# Patient Record
Sex: Female | Born: 1960 | Race: Black or African American | Hispanic: No | Marital: Single | Smoking: Never smoker
Health system: Southern US, Community
[De-identification: ages and names within clinical notes are randomized; demographics above are authoritative.]

## PROBLEM LIST (undated history)

## (undated) DIAGNOSIS — K219 Gastro-esophageal reflux disease without esophagitis: Secondary | ICD-10-CM

## (undated) DIAGNOSIS — Z87440 Personal history of urinary (tract) infections: Secondary | ICD-10-CM

## (undated) HISTORY — DX: Gastro-esophageal reflux disease without esophagitis: K21.9

## (undated) HISTORY — PX: OTHER SURGICAL HISTORY: SHX169

## (undated) HISTORY — PX: BARIATRIC SURGERY: SHX1103

## (undated) HISTORY — PX: TUBAL LIGATION: SHX77

---

## 1997-08-25 ENCOUNTER — Ambulatory Visit (HOSPITAL_COMMUNITY): Admission: RE | Admit: 1997-08-25 | Discharge: 1997-08-25 | Payer: Self-pay | Admitting: Family Medicine

## 2001-10-13 ENCOUNTER — Other Ambulatory Visit: Admission: RE | Admit: 2001-10-13 | Discharge: 2001-10-13 | Payer: Self-pay | Admitting: Gynecology

## 2002-10-14 ENCOUNTER — Other Ambulatory Visit: Admission: RE | Admit: 2002-10-14 | Discharge: 2002-10-14 | Payer: Self-pay | Admitting: Gynecology

## 2003-09-07 ENCOUNTER — Other Ambulatory Visit: Admission: RE | Admit: 2003-09-07 | Discharge: 2003-09-07 | Payer: Self-pay | Admitting: Gynecology

## 2004-06-26 ENCOUNTER — Other Ambulatory Visit: Admission: RE | Admit: 2004-06-26 | Discharge: 2004-06-26 | Payer: Self-pay | Admitting: Gynecology

## 2005-02-12 ENCOUNTER — Other Ambulatory Visit: Admission: RE | Admit: 2005-02-12 | Discharge: 2005-02-12 | Payer: Self-pay | Admitting: Gynecology

## 2006-02-13 ENCOUNTER — Other Ambulatory Visit: Admission: RE | Admit: 2006-02-13 | Discharge: 2006-02-13 | Payer: Self-pay | Admitting: Gynecology

## 2007-03-24 ENCOUNTER — Other Ambulatory Visit: Admission: RE | Admit: 2007-03-24 | Discharge: 2007-03-24 | Payer: Self-pay | Admitting: Gynecology

## 2008-07-19 ENCOUNTER — Emergency Department (HOSPITAL_COMMUNITY): Admission: EM | Admit: 2008-07-19 | Discharge: 2008-07-19 | Payer: Self-pay | Admitting: Family Medicine

## 2009-07-10 ENCOUNTER — Emergency Department (HOSPITAL_COMMUNITY): Admission: EM | Admit: 2009-07-10 | Discharge: 2009-07-10 | Payer: Self-pay | Admitting: Family Medicine

## 2009-10-07 ENCOUNTER — Emergency Department (HOSPITAL_COMMUNITY): Admission: EM | Admit: 2009-10-07 | Discharge: 2009-10-07 | Payer: Self-pay | Admitting: Family Medicine

## 2009-10-07 ENCOUNTER — Ambulatory Visit (HOSPITAL_COMMUNITY): Admission: RE | Admit: 2009-10-07 | Discharge: 2009-10-07 | Payer: Self-pay | Admitting: Family Medicine

## 2010-03-04 LAB — HM PAP SMEAR: HM Pap smear: NORMAL

## 2010-07-13 ENCOUNTER — Inpatient Hospital Stay (INDEPENDENT_AMBULATORY_CARE_PROVIDER_SITE_OTHER)
Admission: RE | Admit: 2010-07-13 | Discharge: 2010-07-13 | Disposition: A | Discharge status: Home / Self Care | Payer: BC Managed Care – PPO | Source: Ambulatory Visit | Attending: Family Medicine | Admitting: Family Medicine

## 2010-07-13 DIAGNOSIS — R109 Unspecified abdominal pain: Secondary | ICD-10-CM

## 2011-03-05 LAB — HM MAMMOGRAPHY: HM Mammogram: NORMAL

## 2011-11-12 ENCOUNTER — Ambulatory Visit: Payer: BC Managed Care – PPO | Admitting: Internal Medicine

## 2011-11-12 DIAGNOSIS — Z0289 Encounter for other administrative examinations: Secondary | ICD-10-CM

## 2012-03-13 ENCOUNTER — Emergency Department (INDEPENDENT_AMBULATORY_CARE_PROVIDER_SITE_OTHER)
Admission: EM | Admit: 2012-03-13 | Discharge: 2012-03-13 | Disposition: A | Discharge status: Home / Self Care | Payer: BC Managed Care – PPO | Source: Home / Self Care | Attending: Emergency Medicine | Admitting: Emergency Medicine

## 2012-03-13 ENCOUNTER — Encounter (HOSPITAL_COMMUNITY): Payer: Self-pay | Admitting: *Deleted

## 2012-03-13 DIAGNOSIS — N390 Urinary tract infection, site not specified: Secondary | ICD-10-CM

## 2012-03-13 LAB — POCT URINALYSIS DIP (DEVICE)
Glucose, UA: NEGATIVE mg/dL
Hgb urine dipstick: NEGATIVE
Ketones, ur: NEGATIVE mg/dL
Leukocytes, UA: NEGATIVE
Nitrite: NEGATIVE
Protein, ur: NEGATIVE mg/dL
Specific Gravity, Urine: >=1.03 (ref 1.005–1.030)
Urobilinogen, UA: 1 mg/dL (ref 0.0–1.0)
pH: 5.5 (ref 5.0–8.0)

## 2012-03-13 MED ORDER — PHENAZOPYRIDINE HCL 200 MG PO TABS: 200.0000 mg | ORAL_TABLET | Freq: Three times a day (TID) | ORAL | Status: DC | PRN

## 2012-03-13 MED ORDER — CEPHALEXIN 500 MG PO CAPS: 500.0000 mg | ORAL_CAPSULE | Freq: Three times a day (TID) | ORAL | Status: DC

## 2012-03-13 NOTE — ED Notes (Signed)
Pt reports frequent urination/lower back pain and pressure - denies any possible exposure to std or unusual discharge.

## 2012-03-13 NOTE — ED Provider Notes (Signed)
Chief Complaint  Patient presents with  . Urinary Tract Infection    History of Present Illness:   Lori Booth is a 52 year old female who has had a one-week history of urinary frequency, urgency, pelvic and lower back pain. She denies any dysuria or hematuria. She's had no fever, chills, nausea, or vomiting. She denies any vaginal discharge, itching, or irregular bleeding. She has had urinary tract infections in the past and this feels just like her usual urinary tract infection. Her last one was about 6 months ago.  Review of Systems:  Other than noted above, the patient denies any of the following symptoms: General:  No fevers, chills, sweats, aches, or fatigue. GI:  No abdominal pain, back pain, nausea, vomiting, diarrhea, or constipation. GU:  No dysuria, frequency, urgency, hematuria, or incontinence. GYN:  No discharge, itching, vulvar pain or lesions, pelvic pain, or abnormal vaginal bleeding.  PMFSH:  Past medical history, family history, social history, meds, and allergies were reviewed.  Physical Exam:   Vital signs:  BP 141/93  Pulse 82  Temp 97 F (36.1 C) (Oral)  Resp 18  SpO2 98% Gen:  Alert, oriented, in no distress. Lungs:  Clear to auscultation, no wheezes, rales or rhonchi. Heart:  Regular rhythm, no gallop or murmer. Abdomen:  Flat and soft. There was slight suprapubic pain to palpation.  No guarding, or rebound.  No hepato-splenomegaly or mass.  Bowel sounds were normally active.  No hernia. Back:  No CVA tenderness.  Skin:  Clear, warm and dry.  Labs:   Results for orders placed during the hospital encounter of 03/13/12  POCT URINALYSIS DIP (DEVICE)      Component Value Range   Glucose, UA NEGATIVE  NEGATIVE mg/dL   Bilirubin Urine SMALL (*) NEGATIVE   Ketones, ur NEGATIVE  NEGATIVE mg/dL   Specific Gravity, Urine >=1.030  1.005 - 1.030   Hgb urine dipstick NEGATIVE  NEGATIVE   pH 5.5  5.0 - 8.0   Protein, ur NEGATIVE  NEGATIVE mg/dL   Urobilinogen, UA 1.0   0.0 - 1.0 mg/dL   Nitrite NEGATIVE  NEGATIVE   Leukocytes, UA NEGATIVE  NEGATIVE     Other Labs Obtained at Urgent Care Center:  A urine culture was obtained.  Results are pending at this time and we will call about any positive results.  Assessment: The encounter diagnosis was UTI (lower urinary tract infection).   Plan:   1.  The following meds were prescribed:   New Prescriptions   CEPHALEXIN (KEFLEX) 500 MG CAPSULE    Take 1 capsule (500 mg total) by mouth 3 (three) times daily.   PHENAZOPYRIDINE (PYRIDIUM) 200 MG TABLET    Take 1 tablet (200 mg total) by mouth 3 (three) times daily as needed for pain.   2.  The patient was instructed in symptomatic care and handouts were given. 3.  The patient was told to return if becoming worse in any way, if no better in 3 or 4 days, and given some red flag symptoms that would indicate earlier return. 4.  The patient was told to avoid intercourse for 10 days, get extra fluids, and return for a follow up with her primary care doctor at the completion of treatment for a repeat UA and culture.     Reuben Likes, MD 03/13/12 [phone]

## 2012-03-15 LAB — URINE CULTURE: Colony Count: >=100000

## 2012-03-16 NOTE — ED Notes (Signed)
Urine culture: >100,000 colonies Citrobacter Koseri.  Pt. Adequately treated with Keflex. Vassie Moselle 03/16/2012

## 2012-05-01 ENCOUNTER — Emergency Department (HOSPITAL_COMMUNITY)
Admission: EM | Admit: 2012-05-01 | Discharge: 2012-05-01 | Disposition: A | Discharge status: Home / Self Care | Payer: BC Managed Care – PPO | Source: Home / Self Care | Attending: Family Medicine | Admitting: Family Medicine

## 2012-05-01 ENCOUNTER — Encounter (HOSPITAL_COMMUNITY): Payer: Self-pay | Admitting: Emergency Medicine

## 2012-05-01 DIAGNOSIS — N390 Urinary tract infection, site not specified: Secondary | ICD-10-CM

## 2012-05-01 LAB — POCT URINALYSIS DIP (DEVICE)
Glucose, UA: NEGATIVE mg/dL
Hgb urine dipstick: NEGATIVE
Ketones, ur: NEGATIVE mg/dL
Leukocytes, UA: NEGATIVE
Nitrite: NEGATIVE
Protein, ur: NEGATIVE mg/dL
Specific Gravity, Urine: 1.025 (ref 1.005–1.030)
Urobilinogen, UA: 2 mg/dL — ABNORMAL HIGH (ref 0.0–1.0)
pH: 6 (ref 5.0–8.0)

## 2012-05-01 MED ORDER — CEPHALEXIN 500 MG PO CAPS: 500.0000 mg | ORAL_CAPSULE | Freq: Four times a day (QID) | ORAL | Status: DC

## 2012-05-01 NOTE — ED Notes (Signed)
Pt c/o pelvic pain x3 days Reports being seen x1 month here at the [facility] Given Keflex Today, sx include: headaches, chills, pain radiates down to legs Denies: hematuria, f/v/n/d, vag discharge, constipation  She is alert w/no signs of acute distress.

## 2012-05-01 NOTE — ED Provider Notes (Signed)
History     CSN: [id number]  Arrival date & time 05/01/12  1414   First MD Initiated Contact with Patient 05/01/12 1422      Chief Complaint  Patient presents with  . Pelvic Pain    (Consider location/radiation/quality/duration/timing/severity/associated sxs/prior treatment) Patient is a 52 y.o. female presenting with frequency. The history is provided by the patient.  Urinary Frequency This is a recurrent problem. The current episode started more than 2 days ago (had citrobacter uti in [place], reports she took all of medicine and sx resolved.). The problem has been gradually worsening. Pertinent negatives include no abdominal pain.    History reviewed. No pertinent past medical history.  History reviewed. No pertinent past surgical history.  No family history on file.  History  Substance Use Topics  . Smoking status: Never Smoker   . Smokeless tobacco: Not on file  . Alcohol Use: Yes     Comment: social    OB History   Grav Para Term Preterm Abortions TAB SAB Ect Mult Living                  Review of Systems  Constitutional: Negative.   Gastrointestinal: Negative.  Negative for abdominal pain.  Genitourinary: Positive for urgency, frequency and pelvic pain. Negative for dysuria, hematuria, vaginal bleeding, vaginal discharge, difficulty urinating, vaginal pain and menstrual problem.    Allergies  Review of patient's allergies indicates no known allergies.  Home Medications   Current Outpatient Rx  Name  Route  Sig  Dispense  Refill  . cephALEXin (KEFLEX) 500 MG capsule   Oral   Take 1 capsule (500 mg total) by mouth 3 (three) times daily.   30 capsule   0   . cephALEXin (KEFLEX) 500 MG capsule   Oral   Take 1 capsule (500 mg total) by mouth 4 (four) times daily. Take all of medicine and drink lots of fluids   20 capsule   0   . phenazopyridine (PYRIDIUM) 200 MG tablet   Oral   Take 1 tablet (200 mg total) by mouth 3 (three) times daily as needed for  pain.   15 tablet   0     BP 126/86  Pulse 85  Temp(Src) 98.9 F (37.2 C) (Oral)  Resp 18  SpO2 96%  Physical Exam  Nursing note and vitals reviewed. Constitutional: She is oriented to person, place, and time. She appears well-developed and well-nourished.  Abdominal: Soft. Bowel sounds are normal. She exhibits no distension and no mass. There is no tenderness. There is no rebound and no guarding.  Neurological: She is alert and oriented to person, place, and time.  Skin: Skin is warm and dry.    ED Course  Procedures (including critical care time)  Labs Reviewed  POCT URINALYSIS DIP (DEVICE) - Abnormal; Notable for the following:    Bilirubin Urine SMALL (*)    Urobilinogen, UA 2.0 (*)    All other components within normal limits  URINE CULTURE   No results found.   1. UTI (lower urinary tract infection)       MDM          Linna Hoff, MD 05/01/12 1537

## 2012-05-02 LAB — URINE CULTURE
Colony Count: 4000
Special Requests: NORMAL

## 2012-05-22 ENCOUNTER — Encounter (HOSPITAL_COMMUNITY): Payer: Self-pay | Admitting: Family Medicine

## 2012-05-22 ENCOUNTER — Emergency Department (HOSPITAL_COMMUNITY): Payer: BC Managed Care – PPO

## 2012-05-22 ENCOUNTER — Emergency Department (HOSPITAL_COMMUNITY)
Admission: EM | Admit: 2012-05-22 | Discharge: 2012-05-22 | Disposition: A | Discharge status: Home / Self Care | Payer: BC Managed Care – PPO | Attending: Emergency Medicine | Admitting: Emergency Medicine

## 2012-05-22 DIAGNOSIS — N390 Urinary tract infection, site not specified: Secondary | ICD-10-CM | POA: Insufficient documentation

## 2012-05-22 DIAGNOSIS — R300 Dysuria: Secondary | ICD-10-CM | POA: Insufficient documentation

## 2012-05-22 DIAGNOSIS — R51 Headache: Secondary | ICD-10-CM | POA: Insufficient documentation

## 2012-05-22 DIAGNOSIS — R509 Fever, unspecified: Secondary | ICD-10-CM | POA: Insufficient documentation

## 2012-05-22 DIAGNOSIS — R3915 Urgency of urination: Secondary | ICD-10-CM | POA: Insufficient documentation

## 2012-05-22 DIAGNOSIS — R110 Nausea: Secondary | ICD-10-CM | POA: Insufficient documentation

## 2012-05-22 HISTORY — DX: Personal history of urinary (tract) infections: Z87.440

## 2012-05-22 LAB — CBC WITH DIFFERENTIAL/PLATELET
Basophils Absolute: 0 10*3/uL (ref 0.0–0.1)
Basophils Relative: 0 % (ref 0–1)
Eosinophils Absolute: 0 10*3/uL (ref 0.0–0.7)
Eosinophils Relative: 1 % (ref 0–5)
HCT: 34.2 % — ABNORMAL LOW (ref 36.0–46.0)
Hemoglobin: 11.8 g/dL — ABNORMAL LOW (ref 12.0–15.0)
Lymphocytes Relative: 15 % (ref 12–46)
Lymphs Abs: 0.6 10*3/uL — ABNORMAL LOW (ref 0.7–4.0)
MCH: 28.3 pg (ref 26.0–34.0)
MCHC: 34.5 g/dL (ref 30.0–36.0)
MCV: 82 fL (ref 78.0–100.0)
Monocytes Absolute: 0.5 10*3/uL (ref 0.1–1.0)
Monocytes Relative: 13 % — ABNORMAL HIGH (ref 3–12)
Neutro Abs: 2.8 10*3/uL (ref 1.7–7.7)
Neutrophils Relative %: 71 % (ref 43–77)
Platelets: 192 10*3/uL (ref 150–400)
RBC: 4.17 MIL/uL (ref 3.87–5.11)
RDW: 13.7 % (ref 11.5–15.5)
WBC: 4 10*3/uL (ref 4.0–10.5)

## 2012-05-22 LAB — URINALYSIS, ROUTINE W REFLEX MICROSCOPIC
Bilirubin Urine: NEGATIVE
Glucose, UA: NEGATIVE mg/dL
Ketones, ur: NEGATIVE mg/dL
Nitrite: NEGATIVE
Protein, ur: NEGATIVE mg/dL
Specific Gravity, Urine: 1.02 (ref 1.005–1.030)
Urobilinogen, UA: 0.2 mg/dL (ref 0.0–1.0)
pH: 5 (ref 5.0–8.0)

## 2012-05-22 LAB — BASIC METABOLIC PANEL
BUN: 12 mg/dL (ref 6–23)
CO2: 25 mEq/L (ref 19–32)
Calcium: 9.8 mg/dL (ref 8.4–10.5)
Chloride: 101 mEq/L (ref 96–112)
Creatinine, Ser: 0.65 mg/dL (ref 0.50–1.10)
GFR calc Af Amer: >90 mL/min (ref >90)
GFR calc non Af Amer: >90 mL/min (ref >90)
Glucose, Bld: 119 mg/dL — ABNORMAL HIGH (ref 70–99)
Potassium: 3.3 mEq/L — ABNORMAL LOW (ref 3.5–5.1)
Sodium: 137 mEq/L (ref 135–145)

## 2012-05-22 LAB — URINE MICROSCOPIC-ADD ON

## 2012-05-22 LAB — WET PREP, GENITAL
Clue Cells Wet Prep HPF POC: NONE SEEN
Trich, Wet Prep: NONE SEEN
Yeast Wet Prep HPF POC: NONE SEEN

## 2012-05-22 MED ORDER — CIPROFLOXACIN HCL 500 MG PO TABS: 500.0000 mg | ORAL_TABLET | Freq: Two times a day (BID) | ORAL | Status: DC

## 2012-05-22 MED ORDER — MORPHINE SULFATE 4 MG/ML IJ SOLN
4.0000 mg | Freq: Once | INTRAMUSCULAR | Status: AC
  Administered 2012-05-22: 4 mg via INTRAVENOUS
  Filled 2012-05-22: qty 1

## 2012-05-22 MED ORDER — SODIUM CHLORIDE 0.9 % IV BOLUS (SEPSIS)
1000.0000 mL | Freq: Once | INTRAVENOUS | Status: AC
  Administered 2012-05-22: 1000 mL via INTRAVENOUS

## 2012-05-22 MED ORDER — HYDROCODONE-ACETAMINOPHEN 5-325 MG PO TABS: 1.0000 | ORAL_TABLET | Freq: Four times a day (QID) | ORAL | Status: DC | PRN

## 2012-05-22 MED ORDER — IBUPROFEN 800 MG PO TABS
800.0000 mg | ORAL_TABLET | Freq: Once | ORAL | Status: AC
  Administered 2012-05-22: 800 mg via ORAL
  Filled 2012-05-22: qty 1

## 2012-05-22 MED ORDER — CIPROFLOXACIN IN D5W 400 MG/200ML IV SOLN
400.0000 mg | Freq: Once | INTRAVENOUS | Status: AC
  Administered 2012-05-22: 400 mg via INTRAVENOUS
  Filled 2012-05-22: qty 200

## 2012-05-22 NOTE — ED Notes (Signed)
Pelvic Cart at bedside 

## 2012-05-22 NOTE — ED Notes (Signed)
Patient transported to CT 

## 2012-05-22 NOTE — ED Provider Notes (Signed)
History     CSN: [id number]  Arrival date & time 05/22/12  0531   First MD Initiated Contact with Patient 05/22/12 [phone]      Chief Complaint  Patient presents with  . Pelvic Pain    (Consider location/radiation/quality/duration/timing/severity/associated sxs/prior treatment) HPI Mrs. Leu is a 52 year old female with past history of recurrent UTIs who presents to the ED with pelvic pain and dysuria. Her last was in February and she also had one in January. She reports frequently having UTIs and has had around 10 in the last couple years. Last night around midnight she developed burning on urination, urgency, and nausea. She also developed a fever, chills, and a mild headache. She states the pain and symptoms are very similar to her previous UTIs. She also reports having some vaginal discomfort and burning with urination. She denies vaginal discharge as well as any prior STI. She denies back pain, chest pain, SOB, vomiting, and hematuria.  Past Medical History  Diagnosis Date  . History of frequent urinary tract infections     Per pt, over the past 15 months, has had 7 UTI's    Past Surgical History  Procedure Laterality Date  . Tubal ligation      Family History  Problem Relation Age of Onset  . Stroke Father     History  Substance Use Topics  . Smoking status: Never Smoker   . Smokeless tobacco: Not on file  . Alcohol Use: Yes     Comment: social    OB History   Grav Para Term Preterm Abortions TAB SAB Ect Mult Living                  Review of Systems All other systems negative except as documented in the HPI. All pertinent positives and negatives as reviewed in the HPI.  Allergies  Review of patient's allergies indicates no known allergies.  Home Medications   Current Outpatient Rx  Name  Route  Sig  Dispense  Refill  . Estradiol Acetate [facility])   Vaginal   Place 1 application vaginally.           BP 110/65  Pulse 112  Temp(Src) 99.6 F  (37.6 C) (Oral)  Resp 16  Ht 5\' 3"  (1.6 m)  Wt 250 lb (113.399 kg)  BMI 44.3 kg/m2  SpO2 99%  Physical Exam  Nursing note and vitals reviewed. Constitutional: She is oriented to person, place, and time. She appears well-developed and well-nourished.  HENT:  Head: Normocephalic and atraumatic.  Mouth/Throat: Oropharynx is clear and moist.  Eyes: Pupils are equal, round, and reactive to light.  Cardiovascular: Normal rate, regular rhythm and normal heart sounds.  Exam reveals no gallop and no friction rub.   No murmur heard. Pulmonary/Chest: Effort normal and breath sounds normal.  Abdominal: Soft. Normal appearance and bowel sounds are normal. There is no rigidity, no rebound, no guarding and no CVA tenderness. No hernia.    Mild tenderness to palpation of suprapubic and inguinal areas of abdomen. No CVA tenderness.  Genitourinary: There is no rash or lesion on the right labia. There is no rash or lesion on the left labia. Uterus is not enlarged and not tender. Cervix exhibits no motion tenderness, no discharge and no friability. Right adnexum displays no mass and no tenderness. Left adnexum displays no mass and no tenderness. No erythema, tenderness or bleeding around the vagina.  Small amount of thin, watery discharge.  Neurological: She is alert  and oriented to person, place, and time. Coordination normal.  Skin: Skin is warm and dry.    ED Course  Procedures (including critical care time)  Labs Reviewed  URINALYSIS, ROUTINE W REFLEX MICROSCOPIC - Abnormal; Notable for the following:    Hgb urine dipstick LARGE (*)    Leukocytes, UA SMALL (*)    All other components within normal limits  CBC WITH DIFFERENTIAL - Abnormal; Notable for the following:    Hemoglobin 11.8 (*)    HCT 34.2 (*)    Lymphs Abs 0.6 (*)    Monocytes Relative 13 (*)    All other components within normal limits  BASIC METABOLIC PANEL - Abnormal; Notable for the following:    Potassium 3.3 (*)     Glucose, Bld 119 (*)    All other components within normal limits  URINE MICROSCOPIC-ADD ON - Abnormal; Notable for the following:    Squamous Epithelial / LPF FEW (*)    All other components within normal limits  WET PREP, GENITAL  GC/CHLAMYDIA PROBE AMP  URINE CULTURE   Patient retreated for UTI, based on her symptoms and history of present illness.  Patient is feeling better following pain medication and fluids.  Patient is referred back to her primary doctor.  Also advised to return to the emergency department for any worsening in her condition and is advised the patient that this still could represent an evolving process, such as appendicitis that has not fully declared itself.  Patient voices an understanding and all questions were answered. MDM  MDM Reviewed: nursing note and vitals Interpretation: labs and CT scan            Carlyle Dolly, PA-C 05/23/12 2346

## 2012-05-22 NOTE — ED Notes (Addendum)
Pt reports hx of UTI's, states this feels the same. Pt c/o right sided and left sided pelvic pain. Pt c/o burning and pain on urination, frequency, some incontinence r/t frequency, chills, headache, and nausea.

## 2012-05-23 LAB — GC/CHLAMYDIA PROBE AMP
CT Probe RNA: NEGATIVE
GC Probe RNA: NEGATIVE

## 2012-05-23 LAB — URINE CULTURE: Colony Count: 3000

## 2012-05-26 NOTE — ED Provider Notes (Signed)
Medical screening examination/treatment/procedure(s) were performed by non-physician practitioner and as supervising physician I was immediately available for consultation/collaboration.  Olivia Mackie, MD 05/26/12 [phone]

## 2012-06-02 ENCOUNTER — Ambulatory Visit (INDEPENDENT_AMBULATORY_CARE_PROVIDER_SITE_OTHER): Payer: BC Managed Care – PPO | Admitting: Internal Medicine

## 2012-06-02 ENCOUNTER — Other Ambulatory Visit (INDEPENDENT_AMBULATORY_CARE_PROVIDER_SITE_OTHER): Payer: BC Managed Care – PPO

## 2012-06-02 ENCOUNTER — Encounter: Payer: Self-pay | Admitting: Internal Medicine

## 2012-06-02 VITALS — BP 118/78 | HR 76 | Temp 97.5°F | Resp 16 | Ht 63.0 in | Wt 207.0 lb

## 2012-06-02 DIAGNOSIS — N6310 Unspecified lump in the right breast, unspecified quadrant: Secondary | ICD-10-CM

## 2012-06-02 DIAGNOSIS — R7309 Other abnormal glucose: Secondary | ICD-10-CM

## 2012-06-02 DIAGNOSIS — Z23 Encounter for immunization: Secondary | ICD-10-CM

## 2012-06-02 DIAGNOSIS — N289 Disorder of kidney and ureter, unspecified: Secondary | ICD-10-CM

## 2012-06-02 DIAGNOSIS — N390 Urinary tract infection, site not specified: Secondary | ICD-10-CM

## 2012-06-02 DIAGNOSIS — N63 Unspecified lump in unspecified breast: Secondary | ICD-10-CM

## 2012-06-02 DIAGNOSIS — R739 Hyperglycemia, unspecified: Secondary | ICD-10-CM | POA: Insufficient documentation

## 2012-06-02 DIAGNOSIS — R399 Unspecified symptoms and signs involving the genitourinary system: Secondary | ICD-10-CM | POA: Insufficient documentation

## 2012-06-02 DIAGNOSIS — E785 Hyperlipidemia, unspecified: Secondary | ICD-10-CM | POA: Insufficient documentation

## 2012-06-02 LAB — COMPREHENSIVE METABOLIC PANEL
ALT: 28 U/L (ref 0–35)
AST: 18 U/L (ref 0–37)
Albumin: 3.9 g/dL (ref 3.5–5.2)
Alkaline Phosphatase: 57 U/L (ref 39–117)
BUN: 10 mg/dL (ref 6–23)
CO2: 28 mEq/L (ref 19–32)
Calcium: 9.4 mg/dL (ref 8.4–10.5)
Chloride: 103 mEq/L (ref 96–112)
Creatinine, Ser: 0.6 mg/dL (ref 0.4–1.2)
GFR: 138.63 mL/min (ref >60.00)
Glucose, Bld: 92 mg/dL (ref 70–99)
Potassium: 4.2 mEq/L (ref 3.5–5.1)
Sodium: 138 mEq/L (ref 135–145)
Total Bilirubin: 0.5 mg/dL (ref 0.3–1.2)
Total Protein: 8 g/dL (ref 6.0–8.3)

## 2012-06-02 LAB — URINALYSIS, ROUTINE W REFLEX MICROSCOPIC
Bilirubin Urine: NEGATIVE
Hgb urine dipstick: NEGATIVE
Ketones, ur: NEGATIVE
Leukocytes, UA: NEGATIVE
Nitrite: NEGATIVE
Specific Gravity, Urine: >=1.03 (ref 1.000–1.030)
Total Protein, Urine: NEGATIVE
Urine Glucose: NEGATIVE
Urobilinogen, UA: 0.2 (ref 0.0–1.0)
pH: 6 (ref 5.0–8.0)

## 2012-06-02 LAB — CBC WITH DIFFERENTIAL/PLATELET
Basophils Absolute: 0 10*3/uL (ref 0.0–0.1)
Basophils Relative: 0.5 % (ref 0.0–3.0)
Eosinophils Absolute: 0.2 10*3/uL (ref 0.0–0.7)
Eosinophils Relative: 2.8 % (ref 0.0–5.0)
HCT: 40.2 % (ref 36.0–46.0)
Hemoglobin: 13.2 g/dL (ref 12.0–15.0)
Lymphocytes Relative: 31.8 % (ref 12.0–46.0)
Lymphs Abs: 2.2 10*3/uL (ref 0.7–4.0)
MCHC: 32.8 g/dL (ref 30.0–36.0)
MCV: 85.7 fl (ref 78.0–100.0)
Monocytes Absolute: 0.5 10*3/uL (ref 0.1–1.0)
Monocytes Relative: 7.2 % (ref 3.0–12.0)
Neutro Abs: 3.9 10*3/uL (ref 1.4–7.7)
Neutrophils Relative %: 57.7 % (ref 43.0–77.0)
Platelets: 275 10*3/uL (ref 150.0–400.0)
RBC: 4.69 Mil/uL (ref 3.87–5.11)
RDW: 14.1 % (ref 11.5–14.6)
WBC: 6.8 10*3/uL (ref 4.5–10.5)

## 2012-06-02 LAB — LDL CHOLESTEROL, DIRECT: Direct LDL: 149.6 mg/dL

## 2012-06-02 LAB — LIPID PANEL
Cholesterol: 230 mg/dL — ABNORMAL HIGH (ref 0–200)
HDL: 61.2 mg/dL (ref >39.00)
Total CHOL/HDL Ratio: 4
Triglycerides: 107 mg/dL (ref 0.0–149.0)
VLDL: 21.4 mg/dL (ref 0.0–40.0)

## 2012-06-02 LAB — TSH: TSH: 1.06 u[IU]/mL (ref 0.35–5.50)

## 2012-06-02 LAB — HEMOGLOBIN A1C: Hgb A1c MFr Bld: 5.7 % (ref 4.6–6.5)

## 2012-06-02 NOTE — Patient Instructions (Signed)
Urinary Tract Infection Urinary tract infections (UTIs) can develop anywhere along your urinary tract. Your urinary tract is your body's drainage system for removing wastes and extra water. Your urinary tract includes two kidneys, two ureters, a bladder, and a urethra. Your kidneys are a pair of bean-shaped organs. Each kidney is about the size of your fist. They are located below your ribs, one on each side of your spine. CAUSES Infections are caused by microbes, which are microscopic organisms, including fungi, viruses, and bacteria. These organisms are so small that they can only be seen through a microscope. Bacteria are the microbes that most commonly cause UTIs. SYMPTOMS  Symptoms of UTIs may vary by age and gender of the patient and by the location of the infection. Symptoms in young women typically include a frequent and intense urge to urinate and a painful, burning feeling in the bladder or urethra during urination. Older women and men are more likely to be tired, shaky, and weak and have muscle aches and abdominal pain. A fever may mean the infection is in your kidneys. Other symptoms of a kidney infection include pain in your back or sides below the ribs, nausea, and vomiting. DIAGNOSIS To diagnose a UTI, your caregiver will ask you about your symptoms. Your caregiver also will ask to provide a urine sample. The urine sample will be tested for bacteria and white blood cells. White blood cells are made by your body to help fight infection. TREATMENT  Typically, UTIs can be treated with medication. Because most UTIs are caused by a bacterial infection, they usually can be treated with the use of antibiotics. The choice of antibiotic and length of treatment depend on your symptoms and the type of bacteria causing your infection. HOME CARE INSTRUCTIONS  If you were prescribed antibiotics, take them exactly as your caregiver instructs you. Finish the medication even if you feel better after you  have only taken some of the medication.  Drink enough water and fluids to keep your urine clear or pale yellow.  Avoid caffeine, tea, and carbonated beverages. They tend to irritate your bladder.  Empty your bladder often. Avoid holding urine for long periods of time.  Empty your bladder before and after sexual intercourse.  After a bowel movement, women should cleanse from front to back. Use each tissue only once. SEEK MEDICAL CARE IF:   You have back pain.  You develop a fever.  Your symptoms do not begin to resolve within 3 days. SEEK IMMEDIATE MEDICAL CARE IF:   You have severe back pain or lower abdominal pain.  You develop chills.  You have nausea or vomiting.  You have continued burning or discomfort with urination. MAKE SURE YOU:   Understand these instructions.  Will watch your condition.  Will get help right away if you are not doing well or get worse. Document Released: 11/28/2004 Document Revised: 08/20/2011 Document Reviewed: 03/29/2011 [employer] Patient Information 2013 [employer].  

## 2012-06-03 ENCOUNTER — Encounter: Payer: Self-pay | Admitting: Internal Medicine

## 2012-06-03 NOTE — Assessment & Plan Note (Signed)
I have asked her to get a diagnostic mammogram done

## 2012-06-03 NOTE — Assessment & Plan Note (Signed)
I will check her A1C to see if she has developed DM2 

## 2012-06-03 NOTE — Assessment & Plan Note (Signed)
She needs to see urology about the recurrent UTI and the abnormal CT scan of the right collecting system

## 2012-06-03 NOTE — Progress Notes (Signed)
Subjective:    Patient ID: Lori Booth, female    DOB: [birth year], 52 y.o.   MRN: [id number]  Urinary Tract Infection  This is a recurrent problem. The current episode started more than 1 year ago. The problem occurs intermittently. The problem has been resolved. The pain is at a severity of 0/10. The patient is experiencing no pain. There has been no fever. The fever has been present for less than 1 day. She is sexually active. There is a history of pyelonephritis. Associated symptoms include chills. Pertinent negatives include no discharge, flank pain, frequency, hematuria, hesitancy, nausea, possible pregnancy, sweats, urgency or vomiting. She has tried antibiotics for the symptoms. The treatment provided mild relief. Her past medical history is significant for recurrent UTIs and urinary stasis. There is no history of catheterization, kidney stones, a single kidney or a urological procedure.      Review of Systems  Constitutional: Positive for chills. Negative for fever, diaphoresis, activity change, appetite change, fatigue and unexpected weight change.  HENT: Negative.   Eyes: Negative.   Respiratory: Negative.   Cardiovascular: Negative.   Gastrointestinal: Negative.  Negative for nausea, vomiting, abdominal pain, diarrhea and constipation.  Endocrine: Negative.   Genitourinary: Negative.  Negative for dysuria, hesitancy, urgency, frequency, hematuria, flank pain, decreased urine volume, vaginal bleeding, vaginal discharge, enuresis, difficulty urinating, genital sores, vaginal pain, menstrual problem, pelvic pain and dyspareunia.  Musculoskeletal: Negative.  Negative for back pain.  Skin: Negative.   Allergic/Immunologic: Negative.   Neurological: Negative.   Hematological: Negative.   Psychiatric/Behavioral: Negative.        Objective:   Physical Exam  Vitals reviewed. Constitutional: She is oriented to person, place, and time. She appears well-developed and well-nourished. No  distress.  HENT:  Head: Normocephalic and atraumatic.  Mouth/Throat: Oropharynx is clear and moist. No oropharyngeal exudate.  Eyes: Conjunctivae are normal. Right eye exhibits no discharge. Left eye exhibits no discharge. No scleral icterus.  Neck: Normal range of motion. Neck supple. No JVD present. No tracheal deviation present. No thyromegaly present.  Cardiovascular: Normal rate, regular rhythm, normal heart sounds and intact distal pulses.  Exam reveals no gallop and no friction rub.   No murmur heard. Pulmonary/Chest: Effort normal and breath sounds normal. No stridor. No respiratory distress. She has no wheezes. She has no rales. Chest wall is not dull to percussion. She exhibits no mass, no tenderness, no bony tenderness, no laceration, no crepitus, no edema, no deformity, no swelling and no retraction. Right breast exhibits no inverted nipple, no mass, no nipple discharge, no skin change and no tenderness. Left breast exhibits no inverted nipple, no mass, no nipple discharge, no skin change and no tenderness. Breasts are asymmetrical.  Right breast is diffusely larger, more firm, and more nodular than the left breast but I do not feel any discreet mass.  Abdominal: Soft. Bowel sounds are normal. She exhibits no distension. There is no tenderness. There is no rebound and no guarding.  Musculoskeletal: Normal range of motion. She exhibits no edema and no tenderness.  Lymphadenopathy:    She has no cervical adenopathy.  Neurological: She is oriented to person, place, and time.  Skin: Skin is warm and dry. No rash noted. She is not diaphoretic. No erythema. No pallor.  Psychiatric: She has a normal mood and affect. Her behavior is normal. Judgment and thought content normal.     Lab Results  Component Value Date   WBC 6.8 06/02/2012   HGB 13.2 06/02/2012  HCT 40.2 06/02/2012   PLT 275.0 06/02/2012   GLUCOSE 92 06/02/2012   CHOL 230* 06/02/2012   TRIG 107.0 06/02/2012   HDL 61.20 06/02/2012    LDLDIRECT 149.6 06/02/2012   ALT 28 06/02/2012   AST 18 06/02/2012   NA 138 06/02/2012   K 4.2 06/02/2012   CL 103 06/02/2012   CREATININE 0.6 06/02/2012   BUN 10 06/02/2012   CO2 28 06/02/2012   TSH 1.06 06/02/2012   HGBA1C 5.7 06/02/2012       Assessment & Plan:

## 2012-06-03 NOTE — Assessment & Plan Note (Signed)
FLP today 

## 2012-06-05 LAB — CULTURE, URINE COMPREHENSIVE: Colony Count: >=100000

## 2012-06-11 LAB — HM PAP SMEAR: HM Pap smear: NORMAL

## 2012-07-23 ENCOUNTER — Ambulatory Visit (INDEPENDENT_AMBULATORY_CARE_PROVIDER_SITE_OTHER): Payer: BC Managed Care – PPO | Admitting: Obstetrics & Gynecology

## 2012-07-23 ENCOUNTER — Other Ambulatory Visit: Payer: Self-pay | Admitting: Obstetrics & Gynecology

## 2012-07-23 ENCOUNTER — Encounter: Payer: Self-pay | Admitting: Obstetrics & Gynecology

## 2012-07-23 VITALS — BP 133/90 | HR 73 | Temp 97.8°F | Ht 63.0 in | Wt 210.0 lb

## 2012-07-23 DIAGNOSIS — N898 Other specified noninflammatory disorders of vagina: Secondary | ICD-10-CM

## 2012-07-23 DIAGNOSIS — Z01419 Encounter for gynecological examination (general) (routine) without abnormal findings: Secondary | ICD-10-CM

## 2012-07-23 DIAGNOSIS — N9489 Other specified conditions associated with female genital organs and menstrual cycle: Secondary | ICD-10-CM

## 2012-07-23 DIAGNOSIS — Z780 Asymptomatic menopausal state: Secondary | ICD-10-CM

## 2012-07-23 MED ORDER — ESTRADIOL 2 MG VA RING: 2.0000 mg | VAGINAL_RING | VAGINAL | Status: DC

## 2012-07-23 NOTE — Patient Instructions (Addendum)
Atrophic Vaginitis Atrophic vaginitis is a problem of low levels of estrogen in women. This problem can happen at any age. It is most common in women who have gone through menopause ("the change").  HOW WILL I KNOW IF I HAVE THIS PROBLEM? You may have:  Trouble with peeing (urinating), such as:  Going to the bathroom often.  A hard time holding your pee until you reach a bathroom.  Leaking pee.  Having pain when you pee.  Itching or a burning feeling.  Vaginal bleeding and spotting.  Pain during sex.  Dryness of the vagina.  A yellow, bad-smelling fluid (discharge) coming from the vagina. HOW WILL MY DOCTOR CHECK FOR THIS PROBLEM?  During your exam, your doctor will likely find the problem.  If there is a vaginal fluid, it may be checked for infection. HOW WILL THIS PROBLEM BE TREATED? Keep the vulvar skin as clean as possible. Moisturizers and lubricants can help with some of the symptoms. Estrogen replacement can help. There are 2 ways to take estrogen:  Systemic estrogen gets estrogen to your whole body. It takes many weeks or months before the symptoms get better.  You take an estrogen pill.  You use a skin patch. This is a patch that you put on your skin.  If you still have your uterus, your doctor may ask you to take a hormone. Talk to your doctor about the right medicine for you.  Estrogen cream.  This puts estrogen only at the part of your body where you apply it. The cream is put into the vagina or put on the vulvar skin. For some women, estrogen cream works faster than pills or the patch. CAN ALL WOMEN WITH THIS PROBLEM USE ESTROGEN? No. Women with certain types of cancer, liver problems, or problems with blood clots should not take estrogen. Your doctor can help you decide the best treatment for your symptoms. Document Released: 08/07/2007 Document Revised: 05/13/2011 Document Reviewed: 08/07/2007 [facility] Patient Information 2014 [place], [place]. Health  Maintenance, Females A healthy lifestyle and preventative care can promote health and wellness.  Maintain regular health, dental, and eye exams.  Eat a healthy diet. Foods like vegetables, fruits, whole grains, low-fat dairy products, and lean protein foods contain the nutrients you need without too many calories. Decrease your intake of foods high in solid fats, added sugars, and salt. Get information about a proper diet from your caregiver, if necessary.  Regular physical exercise is one of the most important things you can do for your health. Most adults should get at least 150 minutes of moderate-intensity exercise (any activity that increases your heart rate and causes you to sweat) each week. In addition, most adults need muscle-strengthening exercises on 2 or more days a week.   Maintain a healthy weight. The body mass index (BMI) is a screening tool to identify possible weight problems. It provides an estimate of body fat based on height and weight. Your caregiver can help determine your BMI, and can help you achieve or maintain a healthy weight. For adults 20 years and older:  A BMI below 18.5 is considered underweight.  A BMI of 18.5 to 24.9 is normal.  A BMI of 25 to 29.9 is considered overweight.  A BMI of 30 and above is considered obese.  Maintain normal blood lipids and cholesterol by exercising and minimizing your intake of saturated fat. Eat a balanced diet with plenty of fruits and vegetables. Blood tests for lipids and cholesterol should begin at age  20 and be repeated every 5 years. If your lipid or cholesterol levels are high, you are over 50, or you are a high risk for heart disease, you may need your cholesterol levels checked more frequently.Ongoing high lipid and cholesterol levels should be treated with medicines if diet and exercise are not effective.  If you smoke, find out from your caregiver how to quit. If you do not use tobacco, do not start.  If you are  pregnant, do not drink alcohol. If you are breastfeeding, be very cautious about drinking alcohol. If you are not pregnant and choose to drink alcohol, do not exceed 1 drink per day. One drink is considered to be 12 ounces (355 mL) of beer, 5 ounces (148 mL) of wine, or 1.5 ounces (44 mL) of liquor.  Avoid use of street drugs. Do not share needles with anyone. Ask for help if you need support or instructions about stopping the use of drugs.  High blood pressure causes heart disease and increases the risk of stroke. Blood pressure should be checked at least every 1 to 2 years. Ongoing high blood pressure should be treated with medicines, if weight loss and exercise are not effective.  If you are 85 to 50 years old, ask your caregiver if you should take aspirin to prevent strokes.  Diabetes screening involves taking a blood sample to check your fasting blood sugar level. This should be done once every 3 years, after age 11, if you are within normal weight and without risk factors for diabetes. Testing should be considered at a younger age or be carried out more frequently if you are overweight and have at least 1 risk factor for diabetes.  Breast cancer screening is essential preventative care for women. You should practice "breast self-awareness." This means understanding the normal appearance and feel of your breasts and may include breast self-examination. Any changes detected, no matter how small, should be reported to a caregiver. Women in their 34s and 30s should have a clinical breast exam (CBE) by a caregiver as part of a regular health exam every 1 to 3 years. After age 24, women should have a CBE every year. Starting at age 56, women should consider having a mammogram (breast X-ray) every year. Women who have a family history of breast cancer should talk to their caregiver about genetic screening. Women at a high risk of breast cancer should talk to their caregiver about having an MRI and a  mammogram every year.  The Pap test is a screening test for cervical cancer. Women should have a Pap test starting at age 33. Between ages 13 and 34, Pap tests should be repeated every 2 years. Beginning at age 57, you should have a Pap test every 3 years as long as the past 3 Pap tests have been normal. If you had a hysterectomy for a problem that was not cancer or a condition that could lead to cancer, then you no longer need Pap tests. If you are between ages 29 and 28, and you have had normal Pap tests going back 10 years, you no longer need Pap tests. If you have had past treatment for cervical cancer or a condition that could lead to cancer, you need Pap tests and screening for cancer for at least 20 years after your treatment. If Pap tests have been discontinued, risk factors (such as a new sexual partner) need to be reassessed to determine if screening should be resumed. Some women have medical problems  that increase the chance of getting cervical cancer. In these cases, your caregiver may recommend more frequent screening and Pap tests.  The human papillomavirus (HPV) test is an additional test that may be used for cervical cancer screening. The HPV test looks for the virus that can cause the cell changes on the cervix. The cells collected during the Pap test can be tested for HPV. The HPV test could be used to screen women aged 16 years and older, and should be used in women of any age who have unclear Pap test results. After the age of 18, women should have HPV testing at the same frequency as a Pap test.  Colorectal cancer can be detected and often prevented. Most routine colorectal cancer screening begins at the age of 46 and continues through age 45. However, your caregiver may recommend screening at an earlier age if you have risk factors for colon cancer. On a yearly basis, your caregiver may provide home test kits to check for hidden blood in the stool. Use of a small camera at the end of a  tube, to directly examine the colon (sigmoidoscopy or colonoscopy), can detect the earliest forms of colorectal cancer. Talk to your caregiver about this at age 80, when routine screening begins. Direct examination of the colon should be repeated every 5 to 10 years through age 87, unless early forms of pre-cancerous polyps or small growths are found.  Hepatitis C blood testing is recommended for all people born from [birth year] through 1963 and any individual with known risks for hepatitis C.  Practice safe sex. Use condoms and avoid high-risk sexual practices to reduce the spread of sexually transmitted infections (STIs). Sexually active women aged 94 and younger should be checked for Chlamydia, which is a common sexually transmitted infection. Older women with new or multiple partners should also be tested for Chlamydia. Testing for other STIs is recommended if you are sexually active and at increased risk.  Osteoporosis is a disease in which the bones lose minerals and strength with aging. This can result in serious bone fractures. The risk of osteoporosis can be identified using a bone density scan. Women ages 19 and over and women at risk for fractures or osteoporosis should discuss screening with their caregivers. Ask your caregiver whether you should be taking a calcium supplement or vitamin D to reduce the rate of osteoporosis.  Menopause can be associated with physical symptoms and risks. Hormone replacement therapy is available to decrease symptoms and risks. You should talk to your caregiver about whether hormone replacement therapy is right for you.  Use sunscreen with a sun protection factor (SPF) of 30 or greater. Apply sunscreen liberally and repeatedly throughout the day. You should seek shade when your shadow is shorter than you. Protect yourself by wearing long sleeves, pants, a wide-brimmed hat, and sunglasses year round, whenever you are outdoors.  Notify your caregiver of new moles or  changes in moles, especially if there is a change in shape or color. Also notify your caregiver if a mole is larger than the size of a pencil eraser.  Stay current with your immunizations. Document Released: 09/03/2010 Document Revised: 05/13/2011 Document Reviewed: 09/03/2010 [facility] Patient Information 2014 [place], [place].

## 2012-07-23 NOTE — Progress Notes (Signed)
Subjective:     Lori Booth is a 52 y.o. female here for a routine exam.  Current complaints: for an annual. Pt states she is having vaginal dryness. Personal health questionnaire reviewed: yes.   Gynecologic History No LMP recorded. Patient is postmenopausal. Contraception: none Last Pap: 2012. Results were: normal Last mammogram: 2014. Results were: normal  Obstetric History OB History   Grav Para Term Preterm Abortions TAB SAB Ect Mult Living                   The following portions of the patient's history were reviewed and updated as appropriate: allergies, current medications, past family history, past medical history, past social history, past surgical history and problem list.  Review of Systems Pertinent items are noted in HPI.    Objective:      General appearance: alert Breasts: normal appearance, no masses or tenderness Abdomen: soft, non-tender; bowel sounds normal; no masses,  no organomegaly Pelvic: cervix normal in appearance, external genitalia normal, no adnexal masses or tenderness, uterus normal size, shape, and consistency and vagina normal without discharge    Assessment:    Healthy female exam.  Vaginal dryness   Plan:   Resume vaginal estrogen

## 2012-07-28 LAB — PAP IG, CT-NG NAA, HPV HIGH-RISK
Chlamydia Probe Amp: NEGATIVE
GC Probe Amp: NEGATIVE
HPV DNA High Risk: NOT DETECTED

## 2013-04-06 ENCOUNTER — Ambulatory Visit
Admission: RE | Admit: 2013-04-06 | Discharge: 2013-04-06 | Disposition: A | Discharge status: Home / Self Care | Payer: BC Managed Care – PPO | Source: Ambulatory Visit | Attending: Obstetrics & Gynecology | Admitting: Obstetrics & Gynecology

## 2013-04-06 DIAGNOSIS — Z780 Asymptomatic menopausal state: Secondary | ICD-10-CM

## 2013-04-13 ENCOUNTER — Encounter: Payer: Self-pay | Admitting: Obstetrics & Gynecology

## 2013-05-26 LAB — HM DEXA SCAN: HM Dexa Scan: NORMAL

## 2013-06-03 ENCOUNTER — Ambulatory Visit (INDEPENDENT_AMBULATORY_CARE_PROVIDER_SITE_OTHER): Payer: BC Managed Care – PPO | Admitting: Internal Medicine

## 2013-06-03 ENCOUNTER — Other Ambulatory Visit (INDEPENDENT_AMBULATORY_CARE_PROVIDER_SITE_OTHER): Payer: BC Managed Care – PPO

## 2013-06-03 ENCOUNTER — Encounter: Payer: Self-pay | Admitting: Internal Medicine

## 2013-06-03 VITALS — BP 110/80 | HR 73 | Temp 97.8°F | Resp 16 | Ht 63.0 in | Wt 209.5 lb

## 2013-06-03 DIAGNOSIS — Z0000 Encounter for general adult medical examination without abnormal findings: Secondary | ICD-10-CM

## 2013-06-03 DIAGNOSIS — Z1231 Encounter for screening mammogram for malignant neoplasm of breast: Secondary | ICD-10-CM | POA: Insufficient documentation

## 2013-06-03 LAB — CBC WITH DIFFERENTIAL/PLATELET
Basophils Absolute: 0 10*3/uL (ref 0.0–0.1)
Basophils Relative: 0.4 % (ref 0.0–3.0)
Eosinophils Absolute: 0.2 10*3/uL (ref 0.0–0.7)
Eosinophils Relative: 2.7 % (ref 0.0–5.0)
HCT: 38.8 % (ref 36.0–46.0)
Hemoglobin: 12.8 g/dL (ref 12.0–15.0)
Lymphocytes Relative: 31.2 % (ref 12.0–46.0)
Lymphs Abs: 2.2 10*3/uL (ref 0.7–4.0)
MCHC: 33 g/dL (ref 30.0–36.0)
MCV: 83.9 fl (ref 78.0–100.0)
Monocytes Absolute: 0.4 10*3/uL (ref 0.1–1.0)
Monocytes Relative: 5.8 % (ref 3.0–12.0)
Neutro Abs: 4.2 10*3/uL (ref 1.4–7.7)
Neutrophils Relative %: 59.9 % (ref 43.0–77.0)
Platelets: 280 10*3/uL (ref 150.0–400.0)
RBC: 4.63 Mil/uL (ref 3.87–5.11)
RDW: 14.4 % (ref 11.5–14.6)
WBC: 7.1 10*3/uL (ref 4.5–10.5)

## 2013-06-03 LAB — COMPREHENSIVE METABOLIC PANEL
ALT: 22 U/L (ref 0–35)
AST: 18 U/L (ref 0–37)
Albumin: 3.8 g/dL (ref 3.5–5.2)
Alkaline Phosphatase: 60 U/L (ref 39–117)
BUN: 8 mg/dL (ref 6–23)
CO2: 30 mEq/L (ref 19–32)
Calcium: 9.7 mg/dL (ref 8.4–10.5)
Chloride: 101 mEq/L (ref 96–112)
Creatinine, Ser: 0.6 mg/dL (ref 0.4–1.2)
GFR: 132.86 mL/min (ref >60.00)
Glucose, Bld: 98 mg/dL (ref 70–99)
Potassium: 3.8 mEq/L (ref 3.5–5.1)
Sodium: 138 mEq/L (ref 135–145)
Total Bilirubin: 0.5 mg/dL (ref 0.3–1.2)
Total Protein: 7.7 g/dL (ref 6.0–8.3)

## 2013-06-03 LAB — LIPID PANEL
Cholesterol: 214 mg/dL — ABNORMAL HIGH (ref 0–200)
HDL: 62.2 mg/dL (ref >39.00)
LDL Cholesterol: 130 mg/dL — ABNORMAL HIGH (ref 0–99)
Total CHOL/HDL Ratio: 3
Triglycerides: 111 mg/dL (ref 0.0–149.0)
VLDL: 22.2 mg/dL (ref 0.0–40.0)

## 2013-06-03 LAB — TSH: TSH: 1.06 u[IU]/mL (ref 0.35–5.50)

## 2013-06-03 NOTE — Progress Notes (Signed)
Pre visit review using our clinic review tool, if applicable. No additional management support is needed unless otherwise documented below in the visit note. 

## 2013-06-03 NOTE — Patient Instructions (Signed)

## 2013-06-03 NOTE — Progress Notes (Signed)
   Subjective:    Patient ID: Lori Booth, female    DOB: June 30, 1960, 53 y.o.   MRN: [id number]  HPI Comments: She returns for a complete physical and she tells me that she feels well and offers no complaints.     Review of Systems  All other systems reviewed and are negative.       Objective:   Physical Exam  Vitals reviewed. Constitutional: She is oriented to person, place, and time. She appears well-developed and well-nourished. No distress.  HENT:  Head: Normocephalic and atraumatic.  Mouth/Throat: Oropharynx is clear and moist. No oropharyngeal exudate.  Eyes: Conjunctivae are normal. Right eye exhibits no discharge. Left eye exhibits no discharge. No scleral icterus.  Neck: Normal range of motion. Neck supple. No JVD present. No tracheal deviation present. No thyromegaly present.  Cardiovascular: Normal rate, regular rhythm, normal heart sounds and intact distal pulses.  Exam reveals no gallop and no friction rub.   No murmur heard. Pulmonary/Chest: Effort normal and breath sounds normal. No stridor. No respiratory distress. She has no wheezes. She has no rales. Chest wall is not dull to percussion. She exhibits no mass, no tenderness, no bony tenderness, no laceration, no crepitus, no edema, no deformity, no swelling and no retraction. Right breast exhibits no inverted nipple, no mass, no nipple discharge, no skin change and no tenderness. Left breast exhibits no inverted nipple, no mass, no nipple discharge, no skin change and no tenderness. Breasts are symmetrical.  Abdominal: Soft. Bowel sounds are normal. She exhibits no distension and no mass. There is no tenderness. There is no rebound and no guarding.  Genitourinary: Rectum normal. Rectal exam shows no external hemorrhoid, no internal hemorrhoid, no fissure, no mass, no tenderness and anal tone normal. Guaiac negative stool. No breast swelling, tenderness, discharge or bleeding. Pelvic exam was performed with patient supine.   Musculoskeletal: Normal range of motion. She exhibits no edema and no tenderness.  Lymphadenopathy:    She has no cervical adenopathy.  Neurological: She is oriented to person, place, and time.  Skin: Skin is warm and dry. No rash noted. She is not diaphoretic. No erythema. No pallor.  Psychiatric: She has a normal mood and affect. Her behavior is normal. Judgment and thought content normal.     Lab Results  Component Value Date   WBC 6.8 06/02/2012   HGB 13.2 06/02/2012   HCT 40.2 06/02/2012   PLT 275.0 06/02/2012   GLUCOSE 92 06/02/2012   CHOL 230* 06/02/2012   TRIG 107.0 06/02/2012   HDL 61.20 06/02/2012   LDLDIRECT 149.6 06/02/2012   ALT 28 06/02/2012   AST 18 06/02/2012   NA 138 06/02/2012   K 4.2 06/02/2012   CL 103 06/02/2012   CREATININE 0.6 06/02/2012   BUN 10 06/02/2012   CO2 28 06/02/2012   TSH 1.06 06/02/2012   HGBA1C 5.7 06/02/2012       Assessment & Plan:

## 2013-06-03 NOTE — Assessment & Plan Note (Signed)
Exam done Vaccines were reviewed She was referred for mammo and colonoscopy Labs ordered Pt ed material was given

## 2013-06-04 ENCOUNTER — Encounter: Payer: Self-pay | Admitting: Internal Medicine

## 2013-06-04 LAB — HEPATITIS C ANTIBODY: HCV Ab: NEGATIVE

## 2013-06-21 LAB — HM MAMMOGRAPHY: HM Mammogram: NORMAL

## 2013-07-16 LAB — HM COLONOSCOPY: HM Colonoscopy: NORMAL

## 2013-07-20 ENCOUNTER — Telehealth: Payer: Self-pay | Admitting: Internal Medicine

## 2013-07-20 NOTE — Telephone Encounter (Signed)
[facility] 4 pages to Dr. Yetta BarreJones

## 2013-07-29 ENCOUNTER — Ambulatory Visit (INDEPENDENT_AMBULATORY_CARE_PROVIDER_SITE_OTHER): Payer: BC Managed Care – PPO | Admitting: Obstetrics & Gynecology

## 2013-07-29 ENCOUNTER — Encounter: Payer: Self-pay | Admitting: Obstetrics & Gynecology

## 2013-07-29 VITALS — BP 130/88 | HR 84 | Temp 97.0°F | Ht 63.0 in | Wt 212.0 lb

## 2013-07-29 DIAGNOSIS — Z01419 Encounter for gynecological examination (general) (routine) without abnormal findings: Secondary | ICD-10-CM

## 2013-07-29 NOTE — Progress Notes (Signed)
Subjective:     Lori Booth is a 53 y.o. female here for a routine exam.   Personal health questionnaire:  Is patient Lori Booth, have a family history of breast and/or ovarian cancer: no Is there a family history of uterine cancer diagnosed at age < 3250, gastrointestinal cancer, urinary tract cancer, family member who is a Personnel officerLynch syndrome-associated carrier: yes Is the patient overweight and hypertensive, family history of diabetes, personal history of gestational diabetes or PCOS: yes Is patient over 7655, have PCOS,  family history of premature CHD under age 51, diabetes, smoke, have hypertension or peripheral artery disease:  no   Gynecologic History No LMP recorded. Patient is postmenopausal.  Last Pap: 1 yr ago. Results were: normal Last mammogram: wnl.   Obstetric History OB History  No data available    Past Medical History  Diagnosis Date  . History of frequent urinary tract infections     Per pt, over the past 15 months, has had 7 UTI's    Past Surgical History  Procedure Laterality Date  . Tubal ligation    . Cesarean section    . Cyrocautery      Current outpatient prescriptions:estradiol (ESTRING) 2 MG vaginal ring, Place 2 mg vaginally every 3 (three) months. follow package directions, Disp: 1 each, Rfl: 12;  PEG 3350-KCl-NaBcb-NaCl-NaSulf (PEG-3350/ELECTROLYTES) 236 G SOLR, , Disp: , Rfl:  No Known Allergies  History  Substance Use Topics  . Smoking status: Never Smoker   . Smokeless tobacco: Never Used  . Alcohol Use: No     Comment: social    Family History  Problem Relation Age of Onset  . Stroke Father   . Heart disease Father   . Hyperlipidemia Father   . Arthritis Mother   . Diabetes Sister   . Cancer Neg Hx   . Alcohol abuse Neg Hx   . Drug abuse Neg Hx   . Early death Neg Hx   . Hearing loss Neg Hx   . Hypertension Neg Hx   . Kidney disease Neg Hx       Review of Systems  Constitutional: negative for fatigue and weight  loss Respiratory: negative for cough and wheezing Cardiovascular: negative for chest pain, fatigue and palpitations Gastrointestinal: negative for abdominal pain and change in bowel habits Musculoskeletal:negative for myalgias Neurological: negative for gait problems and tremors Behavioral/Psych: negative for abusive relationship, depression Endocrine: negative for temperature intolerance   Genitourinary:negative for abnormal menstrual periods, genital lesions, hot flashes, sexual problems and vaginal discharge Integument/breast: negative for breast lump, breast tenderness, nipple discharge and skin lesion(s)    Objective:       BP 130/88  Pulse 84  Temp(Src) 97 F (36.1 C)  Ht 5\' 3"  (1.6 m)  Wt 96.163 kg (212 lb)  BMI 37.56 kg/m2 General:   alert  Skin:   no rash or abnormalities  Lungs:   clear to auscultation bilaterally  Heart:   regular rate and rhythm, S1, S2 normal, no murmur, click, rub or gallop  Breasts:   normal without suspicious masses, skin or nipple changes or axillary nodes  Abdomen:  normal findings: no organomegaly, soft, non-tender and no hernia  Pelvis:  External genitalia: normal general appearance Urinary system: urethral meatus normal and bladder without fullness, nontender Vaginal: normal without tenderness, induration or masses Cervix: normal appearance Adnexa: normal bimanual exam Uterus: anteverted and non-tender, normal size   Lab Review  Labs reviewed no Radiologic studies reviewed no   Assessment:  Healthy female exam.    Plan:    Education reviewed: calcium supplements and weight bearing exercise.   Meds ordered this encounter  Medications  . PEG 3350-KCl-NaBcb-NaCl-NaSulf (PEG-3350/ELECTROLYTES) 236 G SOLR    Sig:     Follow up as needed.

## 2013-07-29 NOTE — Patient Instructions (Signed)
Bone Health Our bones do many things. They provide structure, protect organs, anchor muscles, and store calcium. Adequate calcium in your diet and weight-bearing physical activity help build strong bones, improve bone amounts, and may reduce the risk of weakening of bones (osteoporosis) later in life. PEAK BONE MASS By age 51, the average woman has acquired most of her skeletal bone mass. A large decline occurs in older adults which increases the risk of osteoporosis. In women this occurs around the time of menopause. It is important for young girls to reach their peak bone mass in order to maintain bone health throughout life. A person with high bone mass as a young adult will be more likely to have a higher bone mass later in life. Not enough calcium consumption and physical activity early on could result in a failure to achieve optimum bone mass in adulthood. OSTEOPOROSIS Osteoporosis is a disease of the bones. It is defined as low bone mass with deterioration of bone structure. Osteoporosis leads to an increase risk of fractures with falls. These fractures commonly happen in the wrist, hip, and spine. While men and women of all ages and background can develop osteoporosis, some of the risk factors for osteoporosis are:  Female.  White.  Postmenopausal.  Older adults.  Small in body size.  Eating a diet low in calcium.  Physically inactive.  Smoking.  Use of some medications.  Family history. CALCIUM Calcium is a mineral needed by the body for healthy bones, teeth, and proper function of the heart, muscles, and nerves. The body cannot produce calcium so it must be absorbed through food. Good sources of calcium include:  Dairy products (low fat or nonfat milk, cheese, and yogurt).  Dark green leafy vegetables (bok choy and broccoli).  Calcium fortified foods (orange juice, cereal, bread, soy beverages, and tofu products).  Nuts (almonds). Recommended amounts of calcium vary  for individuals. RECOMMENDED CALCIUM INTAKES Age and Amount in mg per day  Children 1 to 3 years / 700 mg  Children 4 to 8 years / 1,000 mg  Children 9 to 13 years / 1,300 mg  Teens 14 to 18 years / 1,300 mg  Adults 19 to 50 years / 1,000 mg  Adult women 51 to 70 years / 1,200 mg  Adults 71 years and older / 1,200 mg  Pregnant and breastfeeding teens / 1,300 mg  Pregnant and breastfeeding adults / 1,000 mg Vitamin D also plays an important role in healthy bone development. Vitamin D helps in the absorption of calcium. WEIGHT-BEARING PHYSICAL ACTIVITY Regular physical activity has many positive health benefits. Benefits include strong bones. Weight-bearing physical activity early in life is important in reaching peak bone mass. Weight-bearing physical activities cause muscles and bones to work against gravity. Some examples of weight bearing physical activities include:  Walking, jogging, or running.  Field Hockey.  Jumping rope.  Dancing.  Soccer.  Tennis or Racquetball.  Stair climbing.  Basketball.  Hiking.  Weight lifting.  Aerobic fitness classes. Including weight-bearing physical activity into an exercise plan is a great way to keep bones healthy. Adults: Engage in at least 30 minutes of moderate physical activity on most, preferably all, days of the week. Children: Engage in at least 60 minutes of moderate physical activity on most, preferably all, days of the week. FOR MORE INFORMATION [place] Department of Agriculture, Center for Nutrition Policy and Promotion: www.cnpp.usda.gov National Osteoporosis Foundation: www.nof.org Document Released: 05/11/2003 Document Revised: 06/15/2012 Document Reviewed: 08/10/2008 [employer] Patient Information   2014 [employer].  

## 2013-08-13 ENCOUNTER — Encounter: Payer: Self-pay | Admitting: Internal Medicine

## 2013-08-13 ENCOUNTER — Ambulatory Visit (INDEPENDENT_AMBULATORY_CARE_PROVIDER_SITE_OTHER): Payer: BC Managed Care – PPO | Admitting: Internal Medicine

## 2013-08-13 VITALS — BP 122/80 | HR 92 | Temp 97.7°F | Resp 16 | Ht 63.0 in | Wt 207.8 lb

## 2013-08-13 DIAGNOSIS — K219 Gastro-esophageal reflux disease without esophagitis: Secondary | ICD-10-CM

## 2013-08-13 MED ORDER — ESOMEPRAZOLE MAGNESIUM 40 MG PO CPDR: 40.0000 mg | DELAYED_RELEASE_CAPSULE | Freq: Every day | ORAL | Status: DC

## 2013-08-13 NOTE — Progress Notes (Signed)
   Subjective:    Patient ID: Lori Booth, female    DOB: [birth year], 53 y.o.   MRN: [id number]  Gastrophageal Reflux She complains of heartburn and water brash. She reports no abdominal pain, no belching, no chest pain, no choking, no coughing, no dysphagia, no early satiety, no globus sensation, no hoarse voice, no nausea, no sore throat, no stridor, no tooth decay or no wheezing. This is a chronic problem. The current episode started more than 1 month ago. The problem occurs frequently. The problem has been rapidly worsening. The heartburn duration is several minutes. The heartburn is located in the substernum. The heartburn is of mild intensity. The heartburn does not wake her from sleep. The heartburn does not limit her activity. The heartburn doesn't change with position. The symptoms are aggravated by stress. Pertinent negatives include no anemia, fatigue, melena, muscle weakness, orthopnea or weight loss. She has tried an antacid for the symptoms. The treatment provided mild relief.      Review of Systems  Constitutional: Negative.  Negative for fever, chills, weight loss, diaphoresis, appetite change and fatigue.  HENT: Negative.  Negative for hoarse voice and sore throat.   Eyes: Negative.   Respiratory: Negative.  Negative for cough, choking and wheezing.   Cardiovascular: Negative.  Negative for chest pain, palpitations and leg swelling.  Gastrointestinal: Positive for heartburn. Negative for dysphagia, nausea, vomiting, abdominal pain, diarrhea, constipation, blood in stool and melena.  Endocrine: Negative.   Genitourinary: Negative.   Musculoskeletal: Negative.  Negative for muscle weakness.  Skin: Negative.   Allergic/Immunologic: Negative.   Neurological: Negative.   Hematological: Negative.  Negative for adenopathy. Does not bruise/bleed easily.  Psychiatric/Behavioral: Negative.        Objective:   Physical Exam  Vitals reviewed. Constitutional: She is oriented to  person, place, and time. She appears well-developed and well-nourished. No distress.  HENT:  Head: Normocephalic and atraumatic.  Mouth/Throat: Oropharynx is clear and moist. No oropharyngeal exudate.  Eyes: Conjunctivae are normal. Right eye exhibits no discharge. Left eye exhibits no discharge. No scleral icterus.  Neck: Normal range of motion. Neck supple. No JVD present. No tracheal deviation present. No thyromegaly present.  Cardiovascular: Normal rate, regular rhythm, normal heart sounds and intact distal pulses.  Exam reveals no gallop and no friction rub.   No murmur heard. Pulmonary/Chest: Effort normal and breath sounds normal. No stridor. No respiratory distress. She has no wheezes. She has no rales. She exhibits no tenderness.  Abdominal: Soft. Bowel sounds are normal. She exhibits no distension and no mass. There is no tenderness. There is no rebound and no guarding.  Musculoskeletal: Normal range of motion. She exhibits no edema and no tenderness.  Lymphadenopathy:    She has no cervical adenopathy.  Neurological: She is oriented to person, place, and time.  Skin: Skin is warm and dry. No rash noted. She is not diaphoretic. No erythema. No pallor.          Assessment & Plan:

## 2013-08-13 NOTE — Progress Notes (Signed)
Pre visit review using our clinic review tool, if applicable. No additional management support is needed unless otherwise documented below in the visit note. 

## 2013-08-13 NOTE — Assessment & Plan Note (Signed)
Will treat this with nexium 

## 2013-08-13 NOTE — Patient Instructions (Signed)
Gastroesophageal Reflux Disease, Adult  Gastroesophageal reflux disease (GERD) happens when acid from your stomach flows up into the esophagus. When acid comes in contact with the esophagus, the acid causes soreness (inflammation) in the esophagus. Over time, GERD may create small holes (ulcers) in the lining of the esophagus.  CAUSES   · Increased body weight. This puts pressure on the stomach, making acid rise from the stomach into the esophagus.  · Smoking. This increases acid production in the stomach.  · Drinking alcohol. This causes decreased pressure in the lower esophageal sphincter (valve or ring of muscle between the esophagus and stomach), allowing acid from the stomach into the esophagus.  · Late evening meals and a full stomach. This increases pressure and acid production in the stomach.  · A malformed lower esophageal sphincter.  Sometimes, no cause is found.  SYMPTOMS   · Burning pain in the lower part of the mid-chest behind the breastbone and in the mid-stomach area. This may occur twice a week or more often.  · Trouble swallowing.  · Sore throat.  · Dry cough.  · Asthma-like symptoms including chest tightness, shortness of breath, or wheezing.  DIAGNOSIS   Your caregiver may be able to diagnose GERD based on your symptoms. In some cases, X-rays and other tests may be done to check for complications or to check the condition of your stomach and esophagus.  TREATMENT   Your caregiver may recommend over-the-counter or prescription medicines to help decrease acid production. Ask your caregiver before starting or adding any new medicines.   HOME CARE INSTRUCTIONS   · Change the factors that you can control. Ask your caregiver for guidance concerning weight loss, quitting smoking, and alcohol consumption.  · Avoid foods and drinks that make your symptoms worse, such as:  · Caffeine or alcoholic drinks.  · Chocolate.  · Peppermint or mint flavorings.  · Garlic and onions.  · Spicy foods.  · Citrus fruits,  such as oranges, lemons, or limes.  · Tomato-based foods such as sauce, chili, salsa, and pizza.  · Fried and fatty foods.  · Avoid lying down for the 3 hours prior to your bedtime or prior to taking a nap.  · Eat small, frequent meals instead of large meals.  · Wear loose-fitting clothing. Do not wear anything tight around your waist that causes pressure on your stomach.  · Raise the head of your bed 6 to 8 inches with wood blocks to help you sleep. Extra pillows will not help.  · Only take over-the-counter or prescription medicines for pain, discomfort, or fever as directed by your caregiver.  · Do not take aspirin, ibuprofen, or other nonsteroidal anti-inflammatory drugs (NSAIDs).  SEEK IMMEDIATE MEDICAL CARE IF:   · You have pain in your arms, neck, jaw, teeth, or back.  · Your pain increases or changes in intensity or duration.  · You develop nausea, vomiting, or sweating (diaphoresis).  · You develop shortness of breath, or you faint.  · Your vomit is green, yellow, black, or looks like coffee grounds or blood.  · Your stool is red, bloody, or black.  These symptoms could be signs of other problems, such as heart disease, gastric bleeding, or esophageal bleeding.  MAKE SURE YOU:   · Understand these instructions.  · Will watch your condition.  · Will get help right away if you are not doing well or get worse.  Document Released: 11/28/2004 Document Revised: 05/13/2011 Document Reviewed: 09/07/2010  [employer]® Patient   Information ©2014 [employer].

## 2013-10-09 IMAGING — CT CT ABD-PELV W/O CM
2 of 4 series · 16 of 46 positions shown, 18 images · non-contrast
Comparison: None.

CLINICAL DATA: History of UTIs. Left flank pain.  Right-sided and
left-sided pelvic pain.  Pain upon urination.  Frequency.
Incontinence.  Chills.  Nausea.

CT ABDOMEN AND PELVIS WITHOUT CONTRAST
TECHNIQUE: Multidetector CT imaging of the abdomen and pelvis was
performed following the standard protocol without intravenous
contrast.

[Series 2: stone >200 lbs 5.0 b31f st · axial · 0.71mm/px · z∈[-496,-56]mm · 13 of 96 slices shown, 15 images]
[im 4/96  soft-tissue]
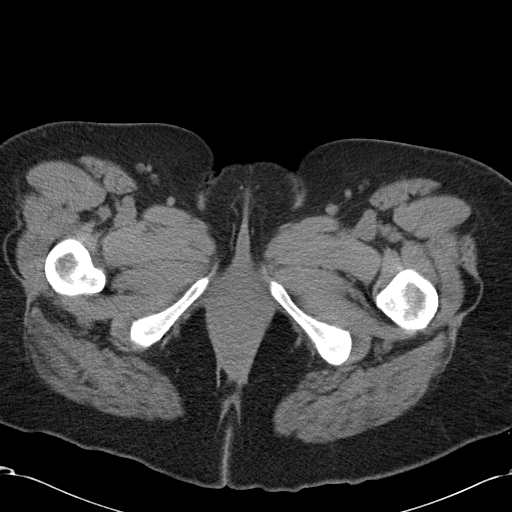
[im 4/96  bone]
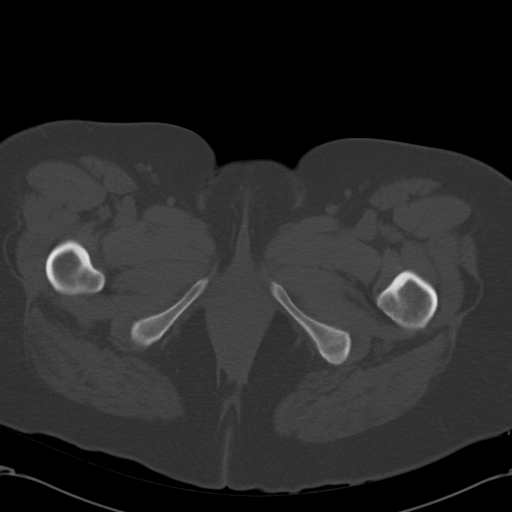
[im 11/96  soft-tissue]
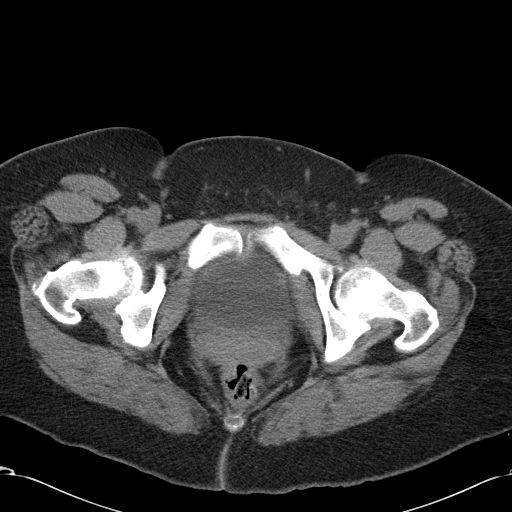
[im 19/96  soft-tissue]
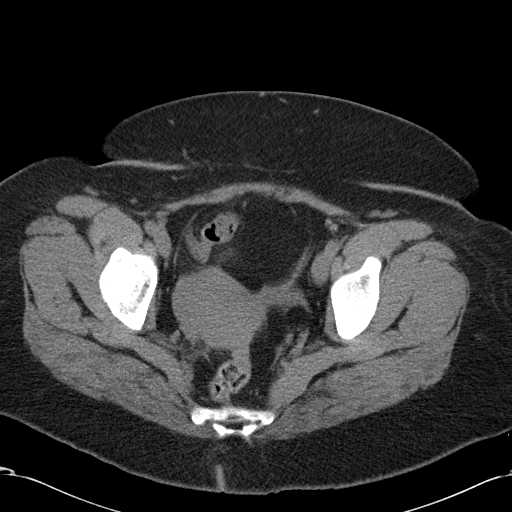
[im 26/96  soft-tissue]
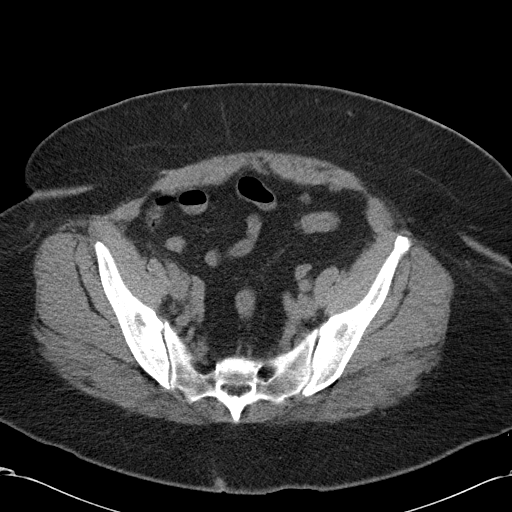
[im 33/96  soft-tissue]
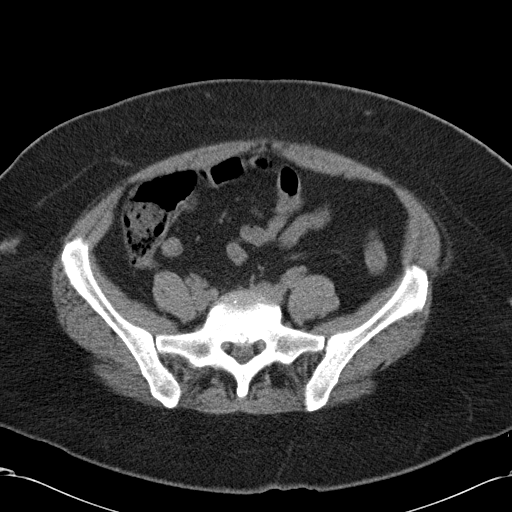
[im 41/96  soft-tissue]
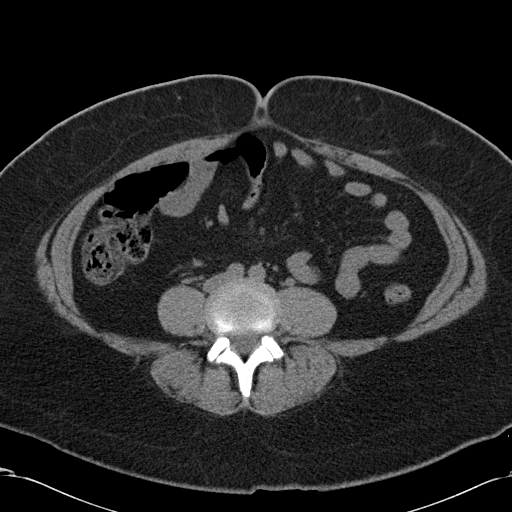
[im 48/96  soft-tissue]
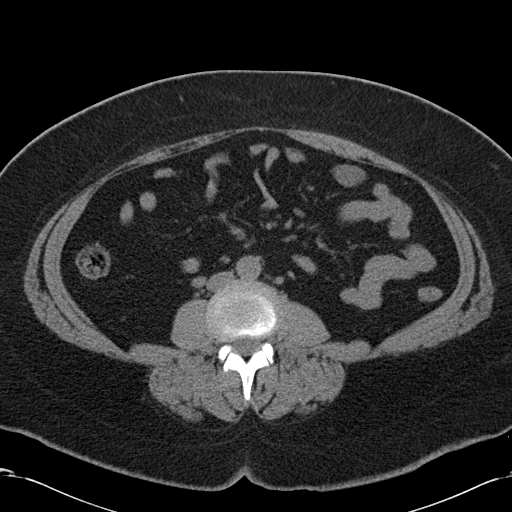
[im 55/96  soft-tissue]
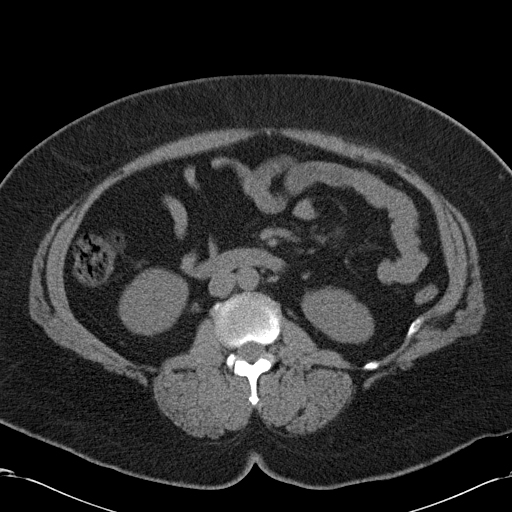
[im 63/96  soft-tissue]
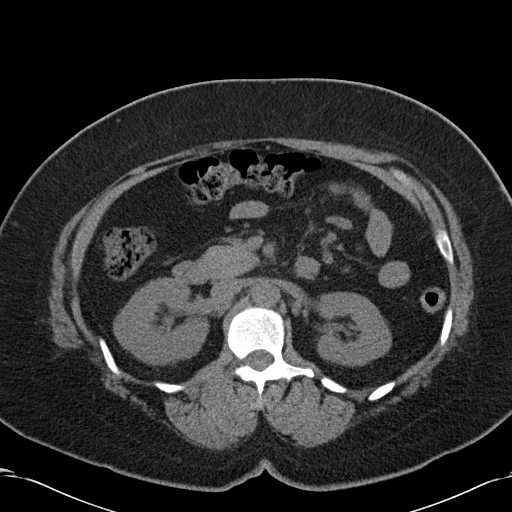
[im 63/96  bone]
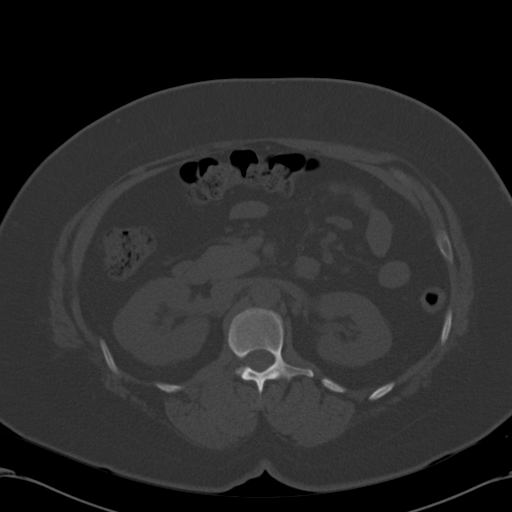
[im 70/96  soft-tissue]
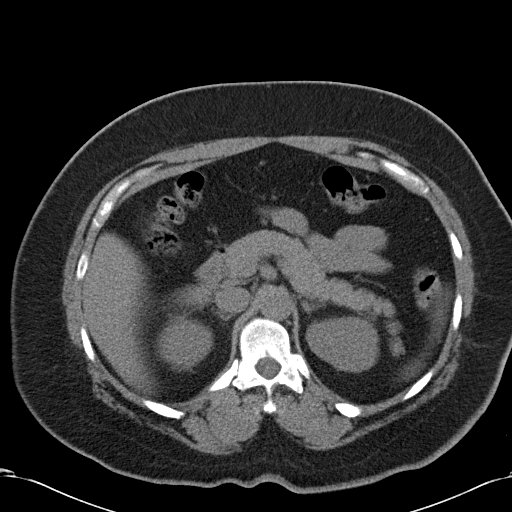
[im 77/96  soft-tissue]
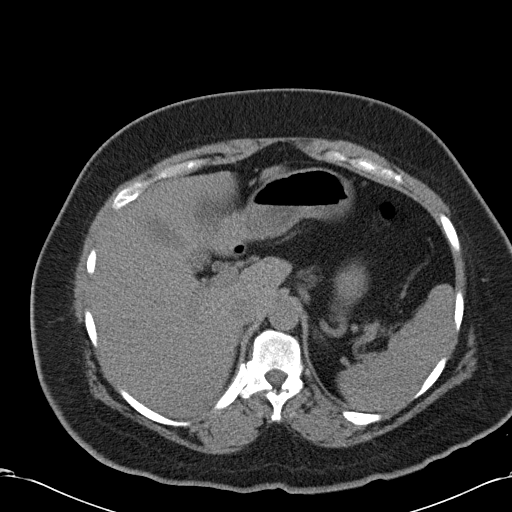
[im 85/96  soft-tissue]
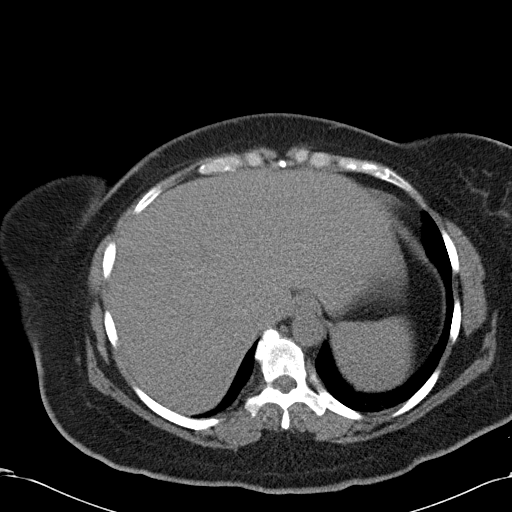
[im 92/96  soft-tissue]
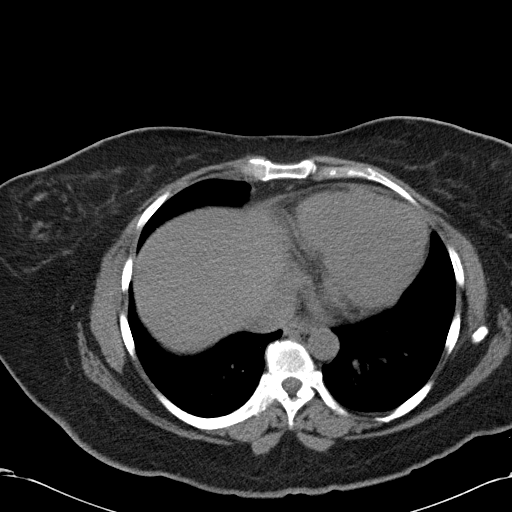

[Series 5: coronals cor · coronal · 0.77mm/px · 3 of 115 slices shown]
[im 39/115  soft-tissue]
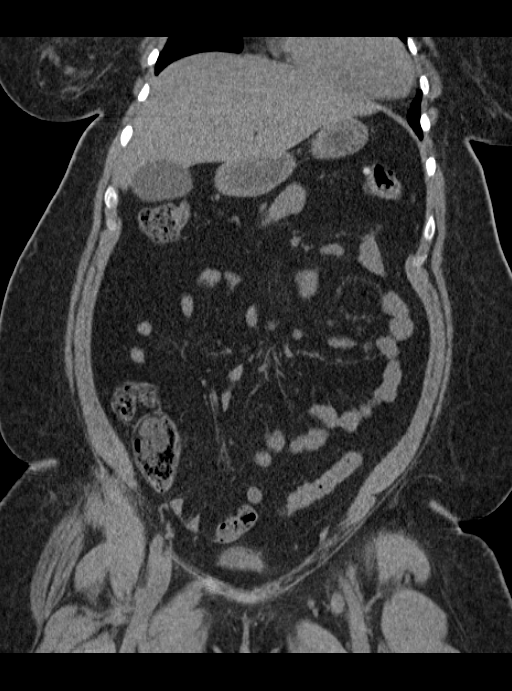
[im 51/115  soft-tissue]
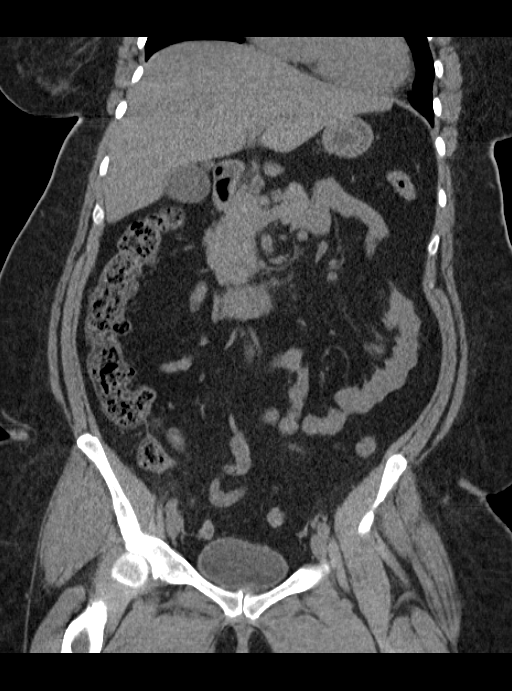
[im 64/115  soft-tissue]
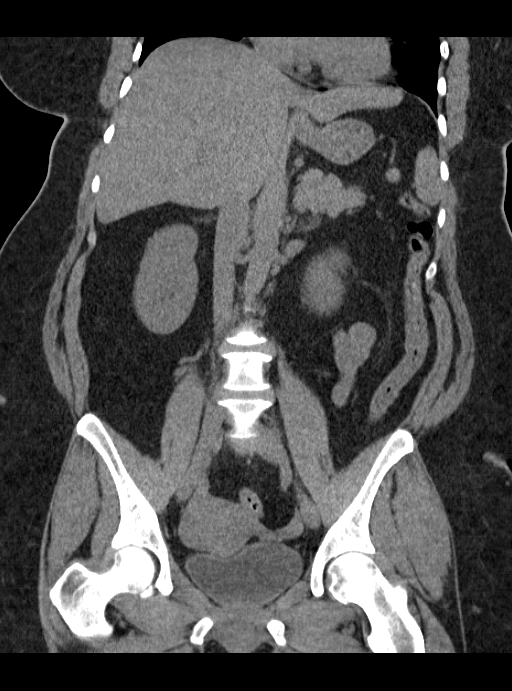

[16 of 46 positions shown; findings below may reference images not displayed]

FINDINGS: Lung bases clear.

Nodularity right breast tissue.  Correlation with mammography
recommended.

No renal or ureteral calculi or findings to suggest renal
collecting system obstruction.  Right renal collecting system
appears duplicated.  Phleboliths within the pelvis felt unlikely to
represent ureteral calculi.  No obvious renal mass noted on this
unenhanced exam.

Non contrast filled views of the urinary bladder without gross
abnormality.

Evaluation of solid abdominal viscera is limited by lack of IV
contrast.  Taking this limitation into account no worrisome
hepatic, splenic, pancreatic or adrenal lesion.  Small accessory
splenic tissue noted.

No calcified gallstones.

No extraluminal bowel inflammatory process, free fluid or free air.

Slight increased number of normal to top normal size mesenteric
lymph nodes.  Etiology/significance indeterminate.

No abdominal aortic aneurysm.

No bony destructive lesion.

Uterus tilted to the right.
IMPRESSION: No renal or ureteral calculi or findings to suggest renal
collecting system obstruction.

Non contrast filled views of the urinary bladder without gross
abnormality.

No extraluminal bowel inflammatory process, free fluid or free air.

Slight increased number of normal to top normal size mesenteric
lymph nodes.  Etiology/significance indeterminate.

Nodularity right breast tissue.  Correlation with mammography
recommended.

## 2014-01-11 ENCOUNTER — Other Ambulatory Visit: Payer: Self-pay | Admitting: Obstetrics & Gynecology

## 2014-01-17 ENCOUNTER — Telehealth: Payer: Self-pay | Admitting: *Deleted

## 2014-01-17 NOTE — Telephone Encounter (Signed)
FYI:  Pt placed call to office regarding refill on Estring.  Refill request was sent via Sures[employer] and was in pool basket. Refill was sent per Nursing supervisor.    Please advise on any changes.

## 2014-02-28 ENCOUNTER — Encounter: Payer: Self-pay | Admitting: *Deleted

## 2014-03-01 ENCOUNTER — Encounter: Payer: Self-pay | Admitting: Obstetrics & Gynecology

## 2014-04-22 ENCOUNTER — Encounter: Payer: Self-pay | Admitting: Family

## 2014-04-22 ENCOUNTER — Ambulatory Visit (INDEPENDENT_AMBULATORY_CARE_PROVIDER_SITE_OTHER): Payer: 59 | Admitting: Family

## 2014-04-22 VITALS — BP 124/88 | HR 90 | Temp 98.0°F | Resp 18 | Ht 63.0 in | Wt 216.0 lb

## 2014-04-22 DIAGNOSIS — M2550 Pain in unspecified joint: Secondary | ICD-10-CM

## 2014-04-22 DIAGNOSIS — M25562 Pain in left knee: Secondary | ICD-10-CM | POA: Insufficient documentation

## 2014-04-22 MED ORDER — NAPROXEN 500 MG PO TABS
500.0000 mg | ORAL_TABLET | Freq: Two times a day (BID) | ORAL | Status: DC
Start: 2014-04-22 — End: 2014-08-31

## 2014-04-22 NOTE — Patient Instructions (Signed)
Thank you for choosing [facility].  Summary/Instructions:  Recommend starting ice/heat as needed for inflammation 2-3 times daily for 20 minutes.  Please take the Naproxen 2 times daily for the next week.   Consider starting glucosamine and chondroitin to help with your joint health.  Your prescription(s) have been submitted to your pharmacy or been printed and provided for you. Please take as directed and contact our office if you believe you are having problem(s) with the medication(s) or have any questions.   If your symptoms worsen or fail to improve, please contact our office for further instruction, or in case of emergency go directly to the emergency room at the closest medical facility.   Arthritis, Nonspecific Arthritis is inflammation of a joint. This usually means pain, redness, warmth or swelling are present. One or more joints may be involved. There are a number of types of arthritis. Your caregiver may not be able to tell what type of arthritis you have right away. CAUSES  The most common cause of arthritis is the wear and tear on the joint (osteoarthritis). This causes damage to the cartilage, which can break down over time. The knees, hips, back and neck are most often affected by this type of arthritis. Other types of arthritis and common causes of joint pain include:  Sprains and other injuries near the joint. Sometimes minor sprains and injuries cause pain and swelling that develop hours later.  Rheumatoid arthritis. This affects hands, feet and knees. It usually affects both sides of your body at the same time. It is often associated with chronic ailments, fever, weight loss and general weakness.  Crystal arthritis. Gout and pseudo gout can cause occasional acute severe pain, redness and swelling in the foot, ankle, or knee.  Infectious arthritis. Bacteria can get into a joint through a break in overlying skin. This can cause infection of the joint. Bacteria and  viruses can also spread through the blood and affect your joints.  Drug, infectious and allergy reactions. Sometimes joints can become mildly painful and slightly swollen with these types of illnesses. SYMPTOMS   Pain is the main symptom.  Your joint or joints can also be red, swollen and warm or hot to the touch.  You may have a fever with certain types of arthritis, or even feel overall ill.  The joint with arthritis will hurt with movement. Stiffness is present with some types of arthritis. DIAGNOSIS  Your caregiver will suspect arthritis based on your description of your symptoms and on your exam. Testing may be needed to find the type of arthritis:  Blood and sometimes urine tests.  X-ray tests and sometimes CT or MRI scans.  Removal of fluid from the joint (arthrocentesis) is done to check for bacteria, crystals or other causes. Your caregiver (or a specialist) will numb the area over the joint with a local anesthetic, and use a needle to remove joint fluid for examination. This procedure is only minimally uncomfortable.  Even with these tests, your caregiver may not be able to tell what kind of arthritis you have. Consultation with a specialist (rheumatologist) may be helpful. TREATMENT  Your caregiver will discuss with you treatment specific to your type of arthritis. If the specific type cannot be determined, then the following general recommendations may apply. Treatment of severe joint pain includes:  Rest.  Elevation.  Anti-inflammatory medication (for example, ibuprofen) may be prescribed. Avoiding activities that cause increased pain.  Only take over-the-counter or prescription medicines for pain and discomfort  as recommended by your caregiver.  Cold packs over an inflamed joint may be used for 10 to 15 minutes every hour. Hot packs sometimes feel better, but do not use overnight. Do not use hot packs if you are diabetic without your caregiver's permission.  A  cortisone shot into arthritic joints may help reduce pain and swelling.  Any acute arthritis that gets worse over the next 1 to 2 days needs to be looked at to be sure there is no joint infection. Long-term arthritis treatment involves modifying activities and lifestyle to reduce joint stress jarring. This can include weight loss. Also, exercise is needed to nourish the joint cartilage and remove waste. This helps keep the muscles around the joint strong. HOME CARE INSTRUCTIONS   Do not take aspirin to relieve pain if gout is suspected. This elevates uric acid levels.  Only take over-the-counter or prescription medicines for pain, discomfort or fever as directed by your caregiver.  Rest the joint as much as possible.  If your joint is swollen, keep it elevated.  Use crutches if the painful joint is in your leg.  Drinking plenty of fluids may help for certain types of arthritis.  Follow your caregiver's dietary instructions.  Try low-impact exercise such as:  Swimming.  Water aerobics.  Biking.  Walking.  Morning stiffness is often relieved by a warm shower.  Put your joints through regular range-of-motion. SEEK MEDICAL CARE IF:   You do not feel better in 24 hours or are getting worse.  You have side effects to medications, or are not getting better with treatment. SEEK IMMEDIATE MEDICAL CARE IF:   You have a fever.  You develop severe joint pain, swelling or redness.  Many joints are involved and become painful and swollen.  There is severe back pain and/or leg weakness.  You have loss of bowel or bladder control. Document Released: 03/28/2004 Document Revised: 05/13/2011 Document Reviewed: 04/13/2008 [facility] Patient Information 2015 [place], [place]. This information is not intended to replace advice given to you by your health care provider. Make sure you discuss any questions you have with your health care provider.

## 2014-04-22 NOTE — Assessment & Plan Note (Signed)
Symptoms and exam consistent with arthritis. Start naproxen. Recommend ice/heat to 3 times per day. May also consider adding glucosamine/chondroitin as supplementation. Follow-up if symptoms worsen or fail to improve.

## 2014-04-22 NOTE — Progress Notes (Signed)
   Subjective:    Patient ID: Lori DuverneySharon Holcomb, female    DOB: [birth year], 54 y.o.   MRN: [id number]  Chief Complaint  Patient presents with  . Leg Pain    bilateral leg pain since monday, has done nothing different to make them hurt she thinks weight is a factor, says her feet have been swollen    HPI:  Lori DuverneySharon Drew is a 54 y.o. female who presents today for an acute visit.  This is a new problem. Associated symptom of bilateral joint pain in her knees and ankles that has been going on for 4 days. Denies any associated trauma or injury. Pain is rated a 7/10 and is modified by using ibuprofen which brings the pain down to 5/10. Timing of the pain is better in the morning and worsens as the day progresses.   No Known Allergies   Current Outpatient Prescriptions on File Prior to Visit  Medication Sig Dispense Refill  . ESTRING 2 MG vaginal ring PLACE 2 MG VAGINALLY EVERY 3 (THREE) MONTHS. FOLLOW PACKAGE DIRECTIONS 1 each 3   No current facility-administered medications on file prior to visit.    Review of Systems  Constitutional: Negative for fever and fatigue.  Musculoskeletal:       Positive for bilateral leg pain.       Objective:    BP 124/88 mmHg  Pulse 90  Temp(Src) 98 F (36.7 C) (Oral)  Resp 18  Ht 5\' 3"  (1.6 m)  Wt 216 lb (97.977 kg)  BMI 38.27 kg/m2  SpO2 98% Nursing note and vital signs reviewed.  Physical Exam  Constitutional: She is oriented to person, place, and time. She appears well-developed and well-nourished. No distress.  Cardiovascular: Normal rate, regular rhythm, normal heart sounds and intact distal pulses.   Pulmonary/Chest: Effort normal and breath sounds normal.  Musculoskeletal:  Bilateral knees and ankles: No obvious deformity, discoloration, or edema of knees or ankles noted. Mild tenderness elicited on left medial joint line. Ligamentous testing is negative. Strength, pulses, and sensation are intact and appropriate bilaterally.    Neurological: She is alert and oriented to person, place, and time.  Skin: Skin is warm and dry.  Psychiatric: She has a normal mood and affect. Her behavior is normal. Judgment and thought content normal.       Assessment & Plan:

## 2014-04-22 NOTE — Progress Notes (Signed)
Pre visit review using our clinic review tool, if applicable. No additional management support is needed unless otherwise documented below in the visit note. 

## 2014-05-30 ENCOUNTER — Emergency Department (INDEPENDENT_AMBULATORY_CARE_PROVIDER_SITE_OTHER)
Admission: EM | Admit: 2014-05-30 | Discharge: 2014-05-30 | Disposition: A | Discharge status: Home / Self Care | Payer: 59 | Source: Home / Self Care | Attending: Emergency Medicine | Admitting: Emergency Medicine

## 2014-05-30 ENCOUNTER — Encounter (HOSPITAL_COMMUNITY): Payer: Self-pay | Admitting: Emergency Medicine

## 2014-05-30 DIAGNOSIS — K2100 Gastro-esophageal reflux disease with esophagitis, without bleeding: Secondary | ICD-10-CM

## 2014-05-30 DIAGNOSIS — M940 Chondrocostal junction syndrome [Tietze]: Secondary | ICD-10-CM

## 2014-05-30 MED ORDER — SUCRALFATE 1 G PO TABS: 1.0000 g | ORAL_TABLET | Freq: Three times a day (TID) | ORAL | Status: DC

## 2014-05-30 NOTE — ED Provider Notes (Signed)
CSN: [id number]     Arrival date & time 05/30/14  1053 History   First MD Initiated Contact with Patient 05/30/14 1340     Chief Complaint  Patient presents with  . Gastrophageal Reflux   (Consider location/radiation/quality/duration/timing/severity/associated sxs/prior Treatment) HPI Comments: 54 year old female complaining of indigestion for 3 days. It is worse when lying down. Better when sitting up. She states it feels exactly the same way it did a couple months ago when she saw her doctor and was diagnosed with GERD. She was taking Nexium after initially diagnosed and when she started feeling better and her symptoms were abating she stopped taking the medication. The symptoms have since returned. She has 2 components of pain in her chest. One is the "indigestion-like pain the other is chest wall pain. It also hurts when she takes a deep breath and when the center of her chest is palpated. She has been taking Gaviscon and recently restarted her Nexium but she is not better yet. Another aggravating factors is that she was given Naprosyn to take recently for her arthritis.   Past Medical History  Diagnosis Date  . History of frequent urinary tract infections     Per pt, over the past 15 months, has had 7 UTI's  . GERD (gastroesophageal reflux disease)    Past Surgical History  Procedure Laterality Date  . Tubal ligation    . Cesarean section    . Cyrocautery     Family History  Problem Relation Age of Onset  . Stroke Father   . Heart disease Father   . Hyperlipidemia Father   . Arthritis Mother   . Diabetes Sister   . Cancer Neg Hx   . Alcohol abuse Neg Hx   . Drug abuse Neg Hx   . Early death Neg Hx   . Hearing loss Neg Hx   . Hypertension Neg Hx   . Kidney disease Neg Hx    History  Substance Use Topics  . Smoking status: Never Smoker   . Smokeless tobacco: Never Used  . Alcohol Use: No     Comment: social   OB History    No data available     Review of Systems   Constitutional: Positive for activity change and appetite change. Negative for fever and fatigue.  HENT: Negative.   Respiratory: Negative.  Negative for cough, shortness of breath and wheezing.   Cardiovascular: Positive for chest pain. Negative for palpitations and leg swelling.  Gastrointestinal: Negative for nausea, abdominal pain, diarrhea, constipation and abdominal distention.       Complains of mild pain with swallowing.  Genitourinary: Negative.   Musculoskeletal: Negative.   Skin: Negative.   Neurological: Negative.     Allergies  Review of patient's allergies indicates no known allergies.  Home Medications   Prior to Admission medications   Medication Sig Start Date End Date Taking? Authorizing Provider  Alum Hydroxide-Mag Carbonate (GAVISCON PO) Take by mouth.   Yes Historical Provider, MD  Esomeprazole Magnesium (NEXIUM PO) Take by mouth.   Yes Historical Provider, MD  ESTRING 2 MG vaginal ring PLACE 2 MG VAGINALLY EVERY 3 (THREE) MONTHS. FOLLOW PACKAGE DIRECTIONS 01/17/14   Antionette CharLisa Jackson-Moore, MD  naproxen (NAPROSYN) 500 MG tablet Take 1 tablet (500 mg total) by mouth 2 (two) times daily with a meal. 04/22/14   Veryl SpeakGregory D Calone, FNP  sucralfate (CARAFATE) 1 G tablet Take 1 tablet (1 g total) by mouth 4 (four) times daily -  with meals and  at bedtime. 05/30/14   Hayden Rasmussen, NP   BP 116/80 mmHg  Pulse 76  Temp(Src) 97.5 F (36.4 C) (Oral)  Resp 18  SpO2 98% Physical Exam  Constitutional: She is oriented to person, place, and time. She appears well-developed and well-nourished. No distress.  HENT:  Bilateral TMs are normal Oropharynx pink with minor, clear PND.  Neck: Normal range of motion. Neck supple.  Cardiovascular: Normal rate, regular rhythm, normal heart sounds and intact distal pulses.   No murmur heard. Pulmonary/Chest: Effort normal and breath sounds normal. No respiratory distress. She has no wheezes. She has no rales. She exhibits tenderness.  Moderate  to severe chest wall tenderness while palpating the bilateral parasternal borders as well as the bilateral lower cost total margins.  Abdominal: Soft. Bowel sounds are normal. She exhibits no distension and no mass. There is no tenderness. There is no rebound and no guarding.  Musculoskeletal: She exhibits no edema.  Lymphadenopathy:    She has no cervical adenopathy.  Neurological: She is alert and oriented to person, place, and time. She exhibits normal muscle tone.  Skin: Skin is dry.  Psychiatric: She has a normal mood and affect.  Nursing note and vitals reviewed.   ED Course  Procedures (including critical care time) Labs Review Labs Reviewed - No data to display  Imaging Review No results found.  ED ECG REPORT   Date: 05/30/2014  Rate: 76    Rhythm: NSR   Intervals: QTC intervals 445 ms PR interval 170 ms  ST/T Wave abnormalities: none  Conduction Disutrbances:  Narrative Interpretation: Normal sinus rhythm, no ectopy. No ST-T changes. No evidence of ischemia. Normal axis.  Old EKG Reviewed: none  I have personally reviewed the EKG tracing and agree with the computerized printout as noted.   MDM   1. Costochondritis   2. Gastroesophageal reflux disease with esophagitis      Restart her Nexium and take daily Take Zantac 150 mg at night, or may take Pepcid. Do not eat within 2 hours of going to bed Eliminate ascitic and fried foods from your diet. Do not take anymore NSAIDs such as Naprosyn, Aleve or ibuprofen. Carafate 1 gm qid See your dr. Later this week     Hayden Rasmussen, NP 05/30/14 1404  Hayden Rasmussen, NP 05/30/14 1408  Hayden Rasmussen, NP 05/30/14 1408

## 2014-05-30 NOTE — Discharge Instructions (Signed)
Gastroesophageal Reflux Disease, Adult Restart her Nexium and take daily Take Zantac 150 mg at night, or may take Pepcid. Do not eat within 2 hours of going to bed Illuminate ascitic and fried foods from your diet. Do not take anymore NSAIDs such as Naprosyn, Aleve or ibuprofen. Gastroesophageal reflux disease (GERD) happens when acid from your stomach flows up into the esophagus. When acid comes in contact with the esophagus, the acid causes soreness (inflammation) in the esophagus. Over time, GERD may create small holes (ulcers) in the lining of the esophagus. CAUSES   Increased body weight. This puts pressure on the stomach, making acid rise from the stomach into the esophagus.  Smoking. This increases acid production in the stomach.  Drinking alcohol. This causes decreased pressure in the lower esophageal sphincter (valve or ring of muscle between the esophagus and stomach), allowing acid from the stomach into the esophagus.  Late evening meals and a full stomach. This increases pressure and acid production in the stomach.  A malformed lower esophageal sphincter. Sometimes, no cause is found. SYMPTOMS   Burning pain in the lower part of the mid-chest behind the breastbone and in the mid-stomach area. This may occur twice a week or more often.  Trouble swallowing.  Sore throat.  Dry cough.  Asthma-like symptoms including chest tightness, shortness of breath, or wheezing. DIAGNOSIS  Your caregiver may be able to diagnose GERD based on your symptoms. In some cases, X-rays and other tests may be done to check for complications or to check the condition of your stomach and esophagus. TREATMENT  Your caregiver may recommend over-the-counter or prescription medicines to help decrease acid production. Ask your caregiver before starting or adding any new medicines.  HOME CARE INSTRUCTIONS   Change the factors that you can control. Ask your caregiver for guidance concerning weight loss,  quitting smoking, and alcohol consumption.  Avoid foods and drinks that make your symptoms worse, such as:  Caffeine or alcoholic drinks.  Chocolate.  Peppermint or mint flavorings.  Garlic and onions.  Spicy foods.  Citrus fruits, such as oranges, lemons, or limes.  Tomato-based foods such as sauce, chili, salsa, and pizza.  Fried and fatty foods.  Avoid lying down for the 3 hours prior to your bedtime or prior to taking a nap.  Eat small, frequent meals instead of large meals.  Wear loose-fitting clothing. Do not wear anything tight around your waist that causes pressure on your stomach.  Raise the head of your bed 6 to 8 inches with wood blocks to help you sleep. Extra pillows will not help.  Only take over-the-counter or prescription medicines for pain, discomfort, or fever as directed by your caregiver.  Do not take aspirin, ibuprofen, or other nonsteroidal anti-inflammatory drugs (NSAIDs). SEEK IMMEDIATE MEDICAL CARE IF:   You have pain in your arms, neck, jaw, teeth, or back.  Your pain increases or changes in intensity or duration.  You develop nausea, vomiting, or sweating (diaphoresis).  You develop shortness of breath, or you faint.  Your vomit is green, yellow, black, or looks like coffee grounds or blood.  Your stool is red, bloody, or black. These symptoms could be signs of other problems, such as heart disease, gastric bleeding, or esophageal bleeding. MAKE SURE YOU:   Understand these instructions.  Will watch your condition.  Will get help right away if you are not doing well or get worse. Document Released: 11/28/2004 Document Revised: 05/13/2011 Document Reviewed: 09/07/2010 [facility] Patient Information 2015 [place], [place].  This information is not intended to replace advice given to you by your health care provider. Make sure you discuss any questions you have with your health care provider.  Costochondritis Costochondritis, sometimes called  Tietze syndrome, is a swelling and irritation (inflammation) of the tissue (cartilage) that connects your ribs with your breastbone (sternum). It causes pain in the chest and rib area. Costochondritis usually goes away on its own over time. It can take up to 6 weeks or longer to get better, especially if you are unable to limit your activities. CAUSES  Some cases of costochondritis have no known cause. Possible causes include:  Injury (trauma).  Exercise or activity such as lifting.  Severe coughing. SIGNS AND SYMPTOMS  Pain and tenderness in the chest and rib area.  Pain that gets worse when coughing or taking deep breaths.  Pain that gets worse with specific movements. DIAGNOSIS  Your health care provider will do a physical exam and ask about your symptoms. Chest X-rays or other tests may be done to rule out other problems. TREATMENT  Costochondritis usually goes away on its own over time. Your health care provider may prescribe medicine to help relieve pain. Apply ice compresses to sore areas HOME CARE INSTRUCTIONS   Avoid exhausting physical activity. Try not to strain your ribs during normal activity. This would include any activities using chest, abdominal, and side muscles, especially if heavy weights are used.  Apply ice to the affected area for the first 2 days after the pain begins.  Put ice in a plastic bag.  Place a towel between your skin and the bag.  Leave the ice on for 20 minutes, 2-3 times a day.  Only take over-the-counter or prescription medicines as directed by your health care provider. SEEK MEDICAL CARE IF:  You have redness or swelling at the rib joints. These are signs of infection.  Your pain does not go away despite rest or medicine. SEEK IMMEDIATE MEDICAL CARE IF:   Your pain increases or you are very uncomfortable.  You have shortness of breath or difficulty breathing.  You cough up blood.  You have worse chest pains, sweating, or  vomiting.  You have a fever or persistent symptoms for more than 2-3 days.  You have a fever and your symptoms suddenly get worse. MAKE SURE YOU:   Understand these instructions.  Will watch your condition.  Will get help right away if you are not doing well or get worse. Document Released: 11/28/2004 Document Revised: 12/09/2012 Document Reviewed: 09/22/2012 [facility] Patient Information 2015 [place], [place]. This information is not intended to replace advice given to you by your health care provider. Make sure you discuss any questions you have with your health care provider.  Food Choices for Gastroesophageal Reflux Disease When you have gastroesophageal reflux disease (GERD), the foods you eat and your eating habits are very important. Choosing the right foods can help ease your discomfort.  WHAT GUIDELINES DO I NEED TO FOLLOW?   Choose fruits, vegetables, whole grains, and low-fat dairy products.   Choose low-fat meat, fish, and poultry.  Limit fats such as oils, salad dressings, butter, nuts, and avocado.   Keep a food diary. This helps you identify foods that cause symptoms.   Avoid foods that cause symptoms. These may be different for everyone.   Eat small meals often instead of 3 large meals a day.   Eat your meals slowly, in a place where you are relaxed.   Limit fried foods.  Cook foods using methods other than frying.   Avoid drinking alcohol.   Avoid drinking large amounts of liquids with your meals.   Avoid bending over or lying down until 2-3 hours after eating.  WHAT FOODS ARE NOT RECOMMENDED?  These are some foods and drinks that may make your symptoms worse: Vegetables Tomatoes. Tomato juice. Tomato and spaghetti sauce. Chili peppers. Onion and garlic. Horseradish. Fruits Oranges, grapefruit, and lemon (fruit and juice). Meats High-fat meats, fish, and poultry. This includes hot dogs, ribs, ham, sausage, salami, and bacon. Dairy Whole  milk and chocolate milk. Sour cream. Cream. Butter. Ice cream. Cream cheese.  Drinks Coffee and tea. Bubbly (carbonated) drinks or energy drinks. Condiments Hot sauce. Barbecue sauce.  Sweets/Desserts Chocolate and cocoa. Donuts. Peppermint and spearmint. Fats and Oils High-fat foods. This includes [place] fries and potato chips. Other Vinegar. Strong spices. This includes black pepper, white pepper, red pepper, cayenne, curry powder, cloves, ginger, and chili powder. The items listed above may not be a complete list of foods and drinks to avoid. Contact your dietitian for more information. Document Released: 08/20/2011 Document Revised: 02/23/2013 Document Reviewed: 12/23/2012 [facility] Patient Information 2015 [place], [place]. This information is not intended to replace advice given to you by your health care provider. Make sure you discuss any questions you have with your health care provider.

## 2014-05-30 NOTE — ED Notes (Signed)
Reports having indigestion, not improved with medicine, points to center chest as location of indigestion, denies chest pain, denies sob.  Reports throat hurting with swallowing.  Denies cough or runny nose.  Patient complains of headache.  Patient also reports mid-back pain yesterday.  No back pain today

## 2014-05-31 ENCOUNTER — Ambulatory Visit: Payer: 59 | Admitting: Internal Medicine

## 2014-08-04 ENCOUNTER — Ambulatory Visit: Payer: BC Managed Care – PPO | Admitting: Obstetrics & Gynecology

## 2014-08-31 ENCOUNTER — Encounter: Payer: Self-pay | Admitting: Internal Medicine

## 2014-08-31 ENCOUNTER — Ambulatory Visit (INDEPENDENT_AMBULATORY_CARE_PROVIDER_SITE_OTHER): Payer: 59 | Admitting: Internal Medicine

## 2014-08-31 VITALS — BP 124/78 | HR 92 | Temp 97.8°F | Ht 63.0 in | Wt 212.0 lb

## 2014-08-31 DIAGNOSIS — M25561 Pain in right knee: Secondary | ICD-10-CM | POA: Insufficient documentation

## 2014-08-31 DIAGNOSIS — R739 Hyperglycemia, unspecified: Secondary | ICD-10-CM | POA: Diagnosis not present

## 2014-08-31 DIAGNOSIS — M79641 Pain in right hand: Secondary | ICD-10-CM | POA: Diagnosis not present

## 2014-08-31 MED ORDER — TRAMADOL HCL 50 MG PO TABS: 50.0000 mg | ORAL_TABLET | Freq: Three times a day (TID) | ORAL | Status: DC | PRN

## 2014-08-31 MED ORDER — NAPROXEN 500 MG PO TABS: 500.0000 mg | ORAL_TABLET | Freq: Two times a day (BID) | ORAL | Status: DC

## 2014-08-31 NOTE — Patient Instructions (Signed)
Please take all new medication as prescribed - the tramadol for pain (we cant give you naproxyn due to allergy to this)  Please use the right wrist splint at night to hopefully help the pain less during the day  Please consider making appointment with Dr Smith/sports medicine in this office if the right knee pain does not improve  Please call in 3-5 days if it seems the treatment is not working for the right hand, for a referral to Hand Surgury  Please continue all other medications as before, and refills have been done if requested.  Please have the pharmacy call with any other refills you may need.  Please keep your appointments with your specialists as you may have planned

## 2014-08-31 NOTE — Progress Notes (Signed)
Pre visit review using our clinic review tool, if applicable. No additional management support is needed unless otherwise documented below in the visit note. 

## 2014-08-31 NOTE — Assessment & Plan Note (Addendum)
?   djd vs anserine bursitis, for tramadol prn as has allergy to nsaid, consider Dr Smith/sport med f/u

## 2014-08-31 NOTE — Assessment & Plan Note (Signed)
Lab Results  Component Value Date   HGBA1C 5.7 06/02/2012   stable overall by history and exam, recent data reviewed with pt, and pt to continue medical treatment as before,  to f/u any worsening symptoms or concerns

## 2014-08-31 NOTE — Assessment & Plan Note (Addendum)
?   Neuritic vs msk - due to overuse, for tramadol prn, right wrist splint trial, no light duty avail per pt, consider hand surgury referral

## 2014-08-31 NOTE — Progress Notes (Signed)
Subjective:    Patient ID: Lori Booth, female    DOB: 1960/03/10, 54 y.o.   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  C/o right hand pain with numbness, no weakness or swelling or skin change.  Quite painful today, had to come in urgently. No trauma or fever. 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 No radiation or other pain to arm prox to the wrist and no wrist swelling.  Also, has to stand to do this, now with medial right knee pain and a tender area specifically to the anserine/medial joint line area. No swelling, crepitus, giveaways.   Pt denies polydipsia, polyuria,   Past Medical History  Diagnosis Date  . History of frequent urinary tract infections     Per pt, over the past 15 months, has had 7 UTI's  . GERD (gastroesophageal reflux disease)    Past Surgical History  Procedure Laterality Date  . Tubal ligation    . Cesarean section    . Cyrocautery      reports that she has never smoked. She has never used smokeless tobacco. She reports that she does not drink alcohol or use illicit drugs. family history includes Arthritis in her mother; Diabetes in her sister; Heart disease in her father; Hyperlipidemia in her father; Stroke in her father. There is no history of Cancer, Alcohol abuse, Drug abuse, Early death, Hearing loss, Hypertension, or Kidney disease. Allergies  Allergen Reactions  . Naproxen Other (See Comments)    Severe GERD   Current Outpatient Prescriptions on File Prior to Visit  Medication Sig Dispense Refill  . Esomeprazole Magnesium (NEXIUM PO) Take by mouth.    . ESTRING 2 MG vaginal ring PLACE 2 MG VAGINALLY EVERY 3 (THREE) MONTHS. FOLLOW PACKAGE DIRECTIONS 1 each 3  . Alum Hydroxide-Mag Carbonate (GAVISCON PO) Take by mouth.    . naproxen (NAPROSYN) 500 MG tablet Take 1 tablet (500 mg total) by mouth 2 (two) times daily with a meal.  (Patient not taking: Reported on 08/31/2014) 60 tablet 1  . sucralfate (CARAFATE) 1 G tablet Take 1 tablet (1 g total) by mouth 4 (four) times daily -  with meals and at bedtime. (Patient not taking: Reported on 08/31/2014) 60 tablet 0   No current facility-administered medications on file prior to visit.   Review of Systems All otherwise neg per pt     Objective:   Physical Exam BP 124/78 mmHg  Pulse 92  Temp(Src) 97.8 F (36.6 C) (Oral)  Ht 5\' 3"  (1.6 m)  Wt 212 lb (96.163 kg)  BMI 37.56 kg/m2  SpO2 97% VS noted,  Constitutional: Pt appears in no significant distress HENT: Head: NCAT.  Right Ear: External ear normal.  Left Ear: External ear normal.  Eyes: . Pupils are equal, round, and reactive to light. Conjunctivae and EOM are normal Neck: Normal range of motion. Neck supple.  Cardiovascular: Normal rate and regular rhythm.   Pulmonary/Chest: Effort normal and breath sounds without rales or wheezing.  Abd:  Soft, NT, ND, + BS Neurological: Pt is alert. Not confused , motor 5/5 intact, dtr/sens intact to UE to LT,  Rgiht wrist with FROM, NT, no effusion Right medial knee with mild tender to anserine/medial joint line only, no knee crepitus or swelling, FROM Skin: Skin is warm. No rash, no LE edema Psychiatric:  Pt behavior is normal. No agitation.     Assessment & Plan:

## 2014-10-09 ENCOUNTER — Other Ambulatory Visit: Payer: Self-pay | Admitting: Internal Medicine

## 2014-10-10 NOTE — Telephone Encounter (Signed)
Please advise, thanks.

## 2014-12-14 ENCOUNTER — Ambulatory Visit (INDEPENDENT_AMBULATORY_CARE_PROVIDER_SITE_OTHER): Payer: BLUE CROSS/BLUE SHIELD | Admitting: Internal Medicine

## 2014-12-14 ENCOUNTER — Encounter: Payer: Self-pay | Admitting: Internal Medicine

## 2014-12-14 ENCOUNTER — Other Ambulatory Visit (INDEPENDENT_AMBULATORY_CARE_PROVIDER_SITE_OTHER): Payer: BLUE CROSS/BLUE SHIELD

## 2014-12-14 VITALS — BP 118/80 | HR 93 | Temp 98.6°F | Wt 213.0 lb

## 2014-12-14 DIAGNOSIS — R590 Localized enlarged lymph nodes: Secondary | ICD-10-CM

## 2014-12-14 LAB — CBC WITH DIFFERENTIAL/PLATELET
Basophils Absolute: 0.1 10*3/uL (ref 0.0–0.1)
Basophils Relative: 0.6 % (ref 0.0–3.0)
Eosinophils Absolute: 0.3 10*3/uL (ref 0.0–0.7)
Eosinophils Relative: 3.1 % (ref 0.0–5.0)
HCT: 40.1 % (ref 36.0–46.0)
Hemoglobin: 13 g/dL (ref 12.0–15.0)
Lymphocytes Relative: 24.1 % (ref 12.0–46.0)
Lymphs Abs: 2.2 10*3/uL (ref 0.7–4.0)
MCHC: 32.3 g/dL (ref 30.0–36.0)
MCV: 84.6 fl (ref 78.0–100.0)
Monocytes Absolute: 0.7 10*3/uL (ref 0.1–1.0)
Monocytes Relative: 8 % (ref 3.0–12.0)
Neutro Abs: 5.8 10*3/uL (ref 1.4–7.7)
Neutrophils Relative %: 64.2 % (ref 43.0–77.0)
Platelets: 301 10*3/uL (ref 150.0–400.0)
RBC: 4.74 Mil/uL (ref 3.87–5.11)
RDW: 14.3 % (ref 11.5–15.5)
WBC: 9.1 10*3/uL (ref 4.0–10.5)

## 2014-12-14 MED ORDER — AZITHROMYCIN 250 MG PO TABS: ORAL_TABLET | ORAL | Status: DC

## 2014-12-14 NOTE — Progress Notes (Signed)
Subjective:  Patient ID: Lori Booth, female    DOB: Nov 10, 1960  Age: 54 y.o. MRN: [id number]  CC: No chief complaint on file.   HPI Lori Booth presents for painful swelling on the L neck x 5 d, hard to swallow due to pain  Outpatient Prescriptions Prior to Visit  Medication Sig Dispense Refill  . Alum Hydroxide-Mag Carbonate (GAVISCON PO) Take by mouth.    . Esomeprazole Magnesium (NEXIUM PO) Take by mouth.    . ESTRING 2 MG vaginal ring PLACE 2 MG VAGINALLY EVERY 3 (THREE) MONTHS. FOLLOW PACKAGE DIRECTIONS 1 each 3  . sucralfate (CARAFATE) 1 G tablet Take 1 tablet (1 g total) by mouth 4 (four) times daily -  with meals and at bedtime. 60 tablet 0  . traMADol (ULTRAM) 50 MG tablet TAKE 1 TABLET BY MOUTH EVERY 8 HOURS AS NEEDED 40 tablet 0   No facility-administered medications prior to visit.    ROS Review of Systems  Constitutional: Negative for chills, activity change, appetite change, fatigue and unexpected weight change.  HENT: Negative for congestion, dental problem, mouth sores and sinus pressure.   Eyes: Negative for visual disturbance.  Respiratory: Negative for cough and chest tightness.   Gastrointestinal: Negative for nausea and abdominal pain.  Genitourinary: Negative for frequency, difficulty urinating and vaginal pain.  Musculoskeletal: Negative for back pain and gait problem.  Skin: Negative for pallor and rash.  Neurological: Negative for dizziness, tremors, weakness, numbness and headaches.  Hematological: Positive for adenopathy. Does not bruise/bleed easily.  Psychiatric/Behavioral: Negative for confusion and sleep disturbance.    Objective:  BP 118/80 mmHg  Pulse 93  Temp(Src) 98.6 F (37 C) (Oral)  Wt 213 lb (96.616 kg)  SpO2 96%  BP Readings from Last 3 Encounters:  12/14/14 118/80  08/31/14 124/78  05/30/14 116/80    Wt Readings from Last 3 Encounters:  12/14/14 213 lb (96.616 kg)  08/31/14 212 lb (96.163 kg)  04/22/14 216 lb (97.977  kg)    Physical Exam  Constitutional: She appears well-developed. No distress.  HENT:  Head: Normocephalic.  Right Ear: External ear normal.  Left Ear: External ear normal.  Nose: Nose normal.  Mouth/Throat: Oropharynx is clear and moist.  Eyes: Conjunctivae are normal. Pupils are equal, round, and reactive to light. Right eye exhibits no discharge. Left eye exhibits no discharge.  Neck: Normal range of motion. Neck supple. No JVD present. No tracheal deviation present. No thyromegaly present.  Cardiovascular: Normal rate, regular rhythm and normal heart sounds.   Pulmonary/Chest: No stridor. No respiratory distress. She has no wheezes.  Abdominal: Soft. Bowel sounds are normal. She exhibits no distension and no mass. There is no tenderness. There is no rebound and no guarding.  Musculoskeletal: She exhibits no edema or tenderness.  Lymphadenopathy:    She has no cervical adenopathy.  Neurological: She displays normal reflexes. No cranial nerve deficit. She exhibits normal muscle tone. Coordination normal.  Skin: No rash noted. No erythema.  Psychiatric: She has a normal mood and affect. Her behavior is normal. Judgment and thought content normal.  Obese L neck swollen painful gland under the jaw  Lab Results  Component Value Date   WBC 9.1 12/14/2014   HGB 13.0 12/14/2014   HCT 40.1 12/14/2014   PLT 301.0 12/14/2014   GLUCOSE 98 06/03/2013   CHOL 214* 06/03/2013   TRIG 111.0 06/03/2013   HDL 62.20 06/03/2013   LDLDIRECT 149.6 06/02/2012   LDLCALC 130* 06/03/2013   ALT 22  06/03/2013   AST 18 06/03/2013   NA 138 06/03/2013   K 3.8 06/03/2013   CL 101 06/03/2013   CREATININE 0.6 06/03/2013   BUN 8 06/03/2013   CO2 30 06/03/2013   TSH 1.06 06/03/2013   HGBA1C 5.7 06/02/2012    No results found.  Assessment & Plan:   Diagnoses and all orders for this visit:  Cervical adenopathy -     CBC with Differential/Platelet; Future  Other orders -     azithromycin  (ZITHROMAX Z-PAK) 250 MG tablet; As directed  I am having Ms. Ceesay start on azithromycin. I am also having her maintain her ESTRING, Esomeprazole Magnesium (NEXIUM PO), Alum Hydroxide-Mag Carbonate (GAVISCON PO), sucralfate, and traMADol.  Meds ordered this encounter  Medications  . azithromycin (ZITHROMAX Z-PAK) 250 MG tablet    Sig: As directed    Dispense:  6 each    Refill:  0     Follow-up: Return in about 2 weeks (around 12/28/2014) for a follow-up visit.  Sonda PrimesAlex Plotnikov, MD

## 2014-12-14 NOTE — Assessment & Plan Note (Signed)
10/16 L acute ?etiol CBC Zpac

## 2014-12-14 NOTE — Progress Notes (Signed)
Pre visit review using our clinic review tool, if applicable. No additional management support is needed unless otherwise documented below in the visit note. 

## 2014-12-14 NOTE — Patient Instructions (Signed)

## 2015-05-23 ENCOUNTER — Encounter: Payer: Self-pay | Admitting: Internal Medicine

## 2015-05-23 ENCOUNTER — Ambulatory Visit (INDEPENDENT_AMBULATORY_CARE_PROVIDER_SITE_OTHER)
Admission: RE | Admit: 2015-05-23 | Discharge: 2015-05-23 | Disposition: A | Discharge status: Home / Self Care | Payer: BLUE CROSS/BLUE SHIELD | Source: Ambulatory Visit | Attending: Internal Medicine | Admitting: Internal Medicine

## 2015-05-23 ENCOUNTER — Ambulatory Visit (INDEPENDENT_AMBULATORY_CARE_PROVIDER_SITE_OTHER): Payer: BLUE CROSS/BLUE SHIELD | Admitting: Internal Medicine

## 2015-05-23 VITALS — BP 110/80 | HR 77 | Temp 98.1°F | Resp 16 | Ht 63.0 in | Wt 218.0 lb

## 2015-05-23 DIAGNOSIS — M79672 Pain in left foot: Secondary | ICD-10-CM

## 2015-05-23 MED ORDER — TRAMADOL HCL 50 MG PO TABS: 50.0000 mg | ORAL_TABLET | Freq: Three times a day (TID) | ORAL | Status: DC | PRN

## 2015-05-23 NOTE — Patient Instructions (Signed)

## 2015-05-23 NOTE — Progress Notes (Signed)
Pre visit review using our clinic review tool, if applicable. No additional management support is needed unless otherwise documented below in the visit note. 

## 2015-05-24 NOTE — Progress Notes (Signed)
Subjective:  Patient ID: Lori Booth, female    DOB: 1960/09/25  Age: 55 y.o. MRN: [id number]  CC: Foot Pain   HPI Lori Booth presents for the complaint of nontraumatic left foot pain for 1 week. She complains of pain at the base of toes #2 and 3. She said her husband thinks the area looks swollen but she doesn't think it's very swollen. She has not noticed a rash, bruising, swelling, redness, numbness, weakness, tingling. She has taken Tylenol for the pain.  Outpatient Prescriptions Prior to Visit  Medication Sig Dispense Refill  . ESTRING 2 MG vaginal ring PLACE 2 MG VAGINALLY EVERY 3 (THREE) MONTHS. FOLLOW PACKAGE DIRECTIONS 1 each 3  . Alum Hydroxide-Mag Carbonate (GAVISCON PO) Take by mouth. Reported on 05/23/2015    . Esomeprazole Magnesium (NEXIUM PO) Take by mouth. Reported on 05/23/2015    . sucralfate (CARAFATE) 1 G tablet Take 1 tablet (1 g total) by mouth 4 (four) times daily -  with meals and at bedtime. (Patient not taking: Reported on 05/23/2015) 60 tablet 0  . azithromycin (ZITHROMAX Z-PAK) 250 MG tablet As directed 6 each 0  . traMADol (ULTRAM) 50 MG tablet TAKE 1 TABLET BY MOUTH EVERY 8 HOURS AS NEEDED (Patient not taking: Reported on 05/23/2015) 40 tablet 0   No facility-administered medications prior to visit.    ROS Review of Systems  Constitutional: Negative.   HENT: Negative.   Eyes: Negative.   Respiratory: Negative.  Negative for cough, choking, chest tightness, shortness of breath and stridor.   Cardiovascular: Negative.   Gastrointestinal: Negative.  Negative for abdominal pain.  Endocrine: Negative.   Genitourinary: Negative.   Musculoskeletal: Positive for arthralgias.  Skin: Negative for rash.  Allergic/Immunologic: Negative.   Neurological: Negative.   Hematological: Negative.  Negative for adenopathy. Does not bruise/bleed easily.  Psychiatric/Behavioral: Negative.     Objective:  BP 110/80 mmHg  Pulse 77  Temp(Src) 98.1 F (36.7 C) (Oral)   Resp 16  Ht 5\' 3"  (1.6 m)  Wt 218 lb (98.884 kg)  BMI 38.63 kg/m2  SpO2 94%  BP Readings from Last 3 Encounters:  05/23/15 110/80  12/14/14 118/80  08/31/14 124/78    Wt Readings from Last 3 Encounters:  05/23/15 218 lb (98.884 kg)  12/14/14 213 lb (96.616 kg)  08/31/14 212 lb (96.163 kg)    Physical Exam  Cardiovascular:  Pulses:      Dorsalis pedis pulses are 1+ on the right side, and 1+ on the left side.       Posterior tibial pulses are 1+ on the right side, and 1+ on the left side.  Musculoskeletal:       Left foot: Normal. There is normal range of motion, no tenderness, no bony tenderness, no swelling, normal capillary refill, no crepitus, no deformity and no laceration.    Lab Results  Component Value Date   WBC 9.1 12/14/2014   HGB 13.0 12/14/2014   HCT 40.1 12/14/2014   PLT 301.0 12/14/2014   GLUCOSE 98 06/03/2013   CHOL 214* 06/03/2013   TRIG 111.0 06/03/2013   HDL 62.20 06/03/2013   LDLDIRECT 149.6 06/02/2012   LDLCALC 130* 06/03/2013   ALT 22 06/03/2013   AST 18 06/03/2013   NA 138 06/03/2013   K 3.8 06/03/2013   CL 101 06/03/2013   CREATININE 0.6 06/03/2013   BUN 8 06/03/2013   CO2 30 06/03/2013   TSH 1.06 06/03/2013   HGBA1C 5.7 06/02/2012    Dg Foot  Complete Left  05/23/2015  CLINICAL DATA:  Generalized foot pain and swelling for the past week without known injury EXAM: LEFT FOOT - COMPLETE 3+ VIEW COMPARISON:  Left foot series of October 07, 2009 FINDINGS: The bones of the left foot are adequately mineralized. The joint spaces are well maintained. There is no acute or healing fracture. There is no lytic nor blastic lesion. There are plantar and Achilles region calcaneal spurs. The soft tissues exhibit no acute abnormalities other than mild diffuse swelling. IMPRESSION: There is no acute bony abnormality of the left foot. Mild diffuse soft tissue swelling is observed. Electronically Signed   By: David  [place] M.D.   On: 05/23/2015 14:01     Assessment & Plan:   Lori Booth was seen today for foot pain.  Diagnoses and all orders for this visit:  Left foot pain- she can't take NSAIDs because they cause severe acid reflux disease, she will continue Tylenol as needed for pain and will also add tramadol for pain. Her plain films are negative with the exception of some soft tissue swelling which is not seen on physical examination. If her symptoms persist then I will consider ordering an MRI to screen for stress fracture and/or soft tissue abnormality. -     traMADol (ULTRAM) 50 MG tablet; Take 1 tablet (50 mg total) by mouth every 8 (eight) hours as needed. -     DG Foot Complete Left; Future   I have discontinued Lori Booth's azithromycin. I have also changed her traMADol. Additionally, I am having her maintain her ESTRING, Esomeprazole Magnesium (NEXIUM PO), Alum Hydroxide-Mag Carbonate (GAVISCON PO), and sucralfate.  Meds ordered this encounter  Medications  . traMADol (ULTRAM) 50 MG tablet    Sig: Take 1 tablet (50 mg total) by mouth every 8 (eight) hours as needed.    Dispense:  40 tablet    Refill:  1    Not to exceed 5 additional fills before 02/27/2015     Follow-up: Return if symptoms worsen or fail to improve.  Sanda Linger, MD

## 2015-05-30 ENCOUNTER — Encounter: Payer: Self-pay | Admitting: *Deleted

## 2015-05-30 ENCOUNTER — Ambulatory Visit (INDEPENDENT_AMBULATORY_CARE_PROVIDER_SITE_OTHER): Payer: BLUE CROSS/BLUE SHIELD | Admitting: Certified Nurse Midwife

## 2015-05-30 ENCOUNTER — Encounter: Payer: Self-pay | Admitting: Certified Nurse Midwife

## 2015-05-30 VITALS — BP 126/89 | HR 86 | Temp 98.0°F | Wt 218.0 lb

## 2015-05-30 DIAGNOSIS — Z01419 Encounter for gynecological examination (general) (routine) without abnormal findings: Secondary | ICD-10-CM

## 2015-05-30 DIAGNOSIS — N959 Unspecified menopausal and perimenopausal disorder: Secondary | ICD-10-CM

## 2015-05-30 DIAGNOSIS — Z8049 Family history of malignant neoplasm of other genital organs: Secondary | ICD-10-CM

## 2015-05-30 DIAGNOSIS — F520 Hypoactive sexual desire disorder: Secondary | ICD-10-CM | POA: Insufficient documentation

## 2015-05-30 MED ORDER — FLIBANSERIN 100 MG PO TABS: 1.0000 | ORAL_TABLET | Freq: Every day | ORAL | Status: DC

## 2015-05-30 MED ORDER — ESTRADIOL ACETATE 0.05 MG/24HR VA RING: 1.0000 | VAGINAL_RING | VAGINAL | Status: DC

## 2015-05-30 NOTE — Progress Notes (Signed)
Patient ID: Lori Booth, female   DOB: 1960-07-15, 55 y.o.   MRN: [id number]    Subjective:      Lori Booth is a 55 y.o. female here for a routine exam.  Current complaints: none.  Has been using Estring.  Desires to go back to femring.  Denies any hotflashes.  Currently sexually active with low libido with her boyfriend.  Discussed Addi, cuddle cream, and counseling.   No hx of abnormal pap smears.  Postmenopausal since the age of 55 years old.   Declines STD screening today.    Personal health questionnaire:  Is patient [name], have a family history of breast and/or ovarian cancer: no Is there a family history of uterine cancer diagnosed at age < 5250, gastrointestinal cancer, urinary tract cancer, family member who is a Personnel officerLynch syndrome-associated carrier: yes, uterine cancer, father deceased of kidney cancer Is the patient overweight and hypertensive, family history of diabetes, personal history of gestational diabetes, preeclampsia or PCOS: yes Is patient over 4555, have PCOS,  family history of premature CHD under age 53, diabetes, smoke, have hypertension or peripheral artery disease:  yes At any time, has a partner hit, kicked or otherwise hurt or frightened you?: no Over the past 2 weeks, have you felt down, depressed or hopeless?: no Over the past 2 weeks, have you felt little interest or pleasure in doing things?:no   Gynecologic History No LMP recorded. Patient is postmenopausal. Contraception: post menopausal status Last Pap: 07/23/12. Results were: normal Last mammogram: 06/2013. Results were: normal  Obstetric History OB History  No data available    Past Medical History  Diagnosis Date  . History of frequent urinary tract infections     Per pt, over the past 15 months, has had 7 UTI's  . GERD (gastroesophageal reflux disease)     Past Surgical History  Procedure Laterality Date  . Tubal ligation    . Cesarean section    . Cyrocautery       Current  outpatient prescriptions:  .  ESTRING 2 MG vaginal ring, PLACE 2 MG VAGINALLY EVERY 3 (THREE) MONTHS. FOLLOW PACKAGE DIRECTIONS, Disp: 1 each, Rfl: 3 .  traMADol (ULTRAM) 50 MG tablet, Take 1 tablet (50 mg total) by mouth every 8 (eight) hours as needed., Disp: 40 tablet, Rfl: 1 .  Estradiol Acetate 0.05 MG/24HR RING, Place 1 each vaginally every 3 (three) months., Disp: 0.9 each, Rfl: 4 .  Flibanserin (ADDYI) 100 MG TABS, Take 1 tablet by mouth at bedtime., Disp: 30 tablet, Rfl: 12 Allergies  Allergen Reactions  . Naproxen Other (See Comments)    Severe GERD    Social History  Substance Use Topics  . Smoking status: Never Smoker   . Smokeless tobacco: Never Used  . Alcohol Use: No     Comment: social    Family History  Problem Relation Age of Onset  . Stroke Father   . Heart disease Father   . Hyperlipidemia Father   . Arthritis Mother   . Diabetes Sister   . Cancer Neg Hx   . Alcohol abuse Neg Hx   . Drug abuse Neg Hx   . Early death Neg Hx   . Hearing loss Neg Hx   . Hypertension Neg Hx   . Kidney disease Neg Hx       Review of Systems  Constitutional: negative for fatigue and weight loss Respiratory: negative for cough and wheezing Cardiovascular: negative for chest pain, fatigue and palpitations Gastrointestinal: negative  for abdominal pain and change in bowel habits Musculoskeletal:negative for myalgias Neurological: negative for gait problems and tremors Behavioral/Psych: negative for abusive relationship, depression Endocrine: negative for temperature intolerance   Genitourinary:negative for abnormal menstrual periods, genital lesions, hot flashes, sexual problems and vaginal discharge Integument/breast: negative for breast lump, breast tenderness, nipple discharge and skin lesion(s)    Objective:       BP 126/89 mmHg  Pulse 86  Temp(Src) 98 F (36.7 C)  Wt 218 lb (98.884 kg) General:   alert  Skin:   no rash or abnormalities  Lungs:   clear to  auscultation bilaterally  Heart:   regular rate and rhythm, S1, S2 normal, no murmur, click, rub or gallop  Breasts:   normal without suspicious masses, skin or nipple changes or axillary nodes  Abdomen:  normal findings: no organomegaly, soft, non-tender and no hernia  Pelvis:  External genitalia: normal general appearance Urinary system: urethral meatus normal and bladder without fullness, nontender Vaginal: normal without tenderness, induration or masses Cervix: normal appearance Adnexa: normal bimanual exam Uterus: anteverted and non-tender, normal size   Lab Review Urine pregnancy test Labs reviewed yes Radiologic studies reviewed yes  50% of 30 min visit spent on counseling and coordination of care.   Assessment:    Healthy female exam.   Hypoactive sexual desire disorder  Postmenopausal symptoms: vaginal dryness  Family hx of cancers: both parents deceased from [place]  Plan:    Education reviewed: calcium supplements, depression evaluation, low fat, low cholesterol diet, safe sex/STD prevention, self breast exams, skin cancer screening and weight bearing exercise. Contraception: post menopausal status. Hormone replacement therapy: hormone replacement therapy: femring. Mammogram ordered. Follow up in: 1 year.   Meds ordered this encounter  Medications  . Estradiol Acetate 0.05 MG/24HR RING    Sig: Place 1 each vaginally every 3 (three) months.    Dispense:  0.9 each    Refill:  4  . Flibanserin (ADDYI) 100 MG TABS    Sig: Take 1 tablet by mouth at bedtime.    Dispense:  30 tablet    Refill:  12   Orders Placed This Encounter  Procedures  . NuSwab Vaginitis (VG)  . Ambulatory referral to Genetics    Referral Priority:  Routine    Referral Type:  Consultation    Referral Reason:  Specialty Services Required    Number of Visits Requested:  1    Possible management options include: Dream Cream Follow up as needed.

## 2015-05-31 ENCOUNTER — Telehealth: Payer: Self-pay

## 2015-05-31 NOTE — Telephone Encounter (Signed)
Called [facility] for patient for appt - they will contact her - their fax machine was not working

## 2015-06-02 ENCOUNTER — Other Ambulatory Visit: Payer: Self-pay | Admitting: Certified Nurse Midwife

## 2015-06-02 DIAGNOSIS — B3731 Acute candidiasis of vulva and vagina: Secondary | ICD-10-CM

## 2015-06-02 DIAGNOSIS — B373 Candidiasis of vulva and vagina: Secondary | ICD-10-CM

## 2015-06-02 LAB — NUSWAB VAGINITIS (VG)
Candida albicans, NAA: NEGATIVE
Candida glabrata, NAA: POSITIVE — AB
Trich vag by NAA: NEGATIVE

## 2015-06-02 MED ORDER — TERCONAZOLE 0.4 % VA CREA: 1.0000 | TOPICAL_CREAM | Freq: Every day | VAGINAL | Status: DC

## 2015-06-05 LAB — PAP IG AND HPV HIGH-RISK
HPV, high-risk: NEGATIVE
PAP Smear Comment: 0

## 2015-06-06 ENCOUNTER — Encounter: Payer: Self-pay | Admitting: Genetic Counselor

## 2015-06-28 ENCOUNTER — Ambulatory Visit (INDEPENDENT_AMBULATORY_CARE_PROVIDER_SITE_OTHER): Payer: BLUE CROSS/BLUE SHIELD | Admitting: Internal Medicine

## 2015-06-28 ENCOUNTER — Other Ambulatory Visit (INDEPENDENT_AMBULATORY_CARE_PROVIDER_SITE_OTHER): Payer: BLUE CROSS/BLUE SHIELD

## 2015-06-28 ENCOUNTER — Encounter: Payer: Self-pay | Admitting: Internal Medicine

## 2015-06-28 VITALS — BP 120/72 | HR 80 | Temp 98.0°F | Resp 16 | Ht 63.0 in | Wt 217.0 lb

## 2015-06-28 DIAGNOSIS — R739 Hyperglycemia, unspecified: Secondary | ICD-10-CM

## 2015-06-28 DIAGNOSIS — E559 Vitamin D deficiency, unspecified: Secondary | ICD-10-CM | POA: Diagnosis not present

## 2015-06-28 DIAGNOSIS — Z0000 Encounter for general adult medical examination without abnormal findings: Secondary | ICD-10-CM

## 2015-06-28 DIAGNOSIS — K219 Gastro-esophageal reflux disease without esophagitis: Secondary | ICD-10-CM | POA: Diagnosis not present

## 2015-06-28 DIAGNOSIS — Z1231 Encounter for screening mammogram for malignant neoplasm of breast: Secondary | ICD-10-CM

## 2015-06-28 LAB — CBC WITH DIFFERENTIAL/PLATELET
Basophils Absolute: 0 10*3/uL (ref 0.0–0.1)
Basophils Relative: 0.4 % (ref 0.0–3.0)
Eosinophils Absolute: 0.2 10*3/uL (ref 0.0–0.7)
Eosinophils Relative: 2.3 % (ref 0.0–5.0)
HCT: 37.3 % (ref 36.0–46.0)
Hemoglobin: 12.3 g/dL (ref 12.0–15.0)
Lymphocytes Relative: 24.7 % (ref 12.0–46.0)
Lymphs Abs: 2.2 10*3/uL (ref 0.7–4.0)
MCHC: 33.1 g/dL (ref 30.0–36.0)
MCV: 83.6 fl (ref 78.0–100.0)
Monocytes Absolute: 0.8 10*3/uL (ref 0.1–1.0)
Monocytes Relative: 8.7 % (ref 3.0–12.0)
Neutro Abs: 5.8 10*3/uL (ref 1.4–7.7)
Neutrophils Relative %: 63.9 % (ref 43.0–77.0)
Platelets: 313 10*3/uL (ref 150.0–400.0)
RBC: 4.46 Mil/uL (ref 3.87–5.11)
RDW: 15.3 % (ref 11.5–15.5)
WBC: 9 10*3/uL (ref 4.0–10.5)

## 2015-06-28 LAB — COMPREHENSIVE METABOLIC PANEL
ALT: 16 U/L (ref 0–35)
AST: 11 U/L (ref 0–37)
Albumin: 4 g/dL (ref 3.5–5.2)
Alkaline Phosphatase: 62 U/L (ref 39–117)
BUN: 10 mg/dL (ref 6–23)
CO2: 31 mEq/L (ref 19–32)
Calcium: 9.7 mg/dL (ref 8.4–10.5)
Chloride: 103 mEq/L (ref 96–112)
Creatinine, Ser: 0.69 mg/dL (ref 0.40–1.20)
GFR: 114.33 mL/min (ref >60.00)
Glucose, Bld: 95 mg/dL (ref 70–99)
Potassium: 3.8 mEq/L (ref 3.5–5.1)
Sodium: 139 mEq/L (ref 135–145)
Total Bilirubin: 0.3 mg/dL (ref 0.2–1.2)
Total Protein: 7.7 g/dL (ref 6.0–8.3)

## 2015-06-28 LAB — TSH: TSH: 0.87 u[IU]/mL (ref 0.35–4.50)

## 2015-06-28 LAB — LIPID PANEL
Cholesterol: 217 mg/dL — ABNORMAL HIGH (ref 0–200)
HDL: 62.1 mg/dL (ref >39.00)
LDL Cholesterol: 125 mg/dL — ABNORMAL HIGH (ref 0–99)
NonHDL: 155.22
Total CHOL/HDL Ratio: 3
Triglycerides: 151 mg/dL — ABNORMAL HIGH (ref 0.0–149.0)
VLDL: 30.2 mg/dL (ref 0.0–40.0)

## 2015-06-28 LAB — HEMOGLOBIN A1C: Hgb A1c MFr Bld: 5.9 % (ref 4.6–6.5)

## 2015-06-28 MED ORDER — CHOLECALCIFEROL 50 MCG (2000 UT) PO TABS: 1.0000 | ORAL_TABLET | Freq: Every day | ORAL | Status: DC

## 2015-06-28 NOTE — Progress Notes (Signed)
Pre visit review using our clinic review tool, if applicable. No additional management support is needed unless otherwise documented below in the visit note. 

## 2015-06-28 NOTE — Progress Notes (Signed)
Subjective:  Patient ID: Lori Booth, female    DOB: Nov 30, 1960  Age: 55 y.o. MRN: [id number]  CC: Foot Pain  CPX and Vit d Defic  HPI Lori Booth presents for for treatment of vitamin D deficiency. She was recently seen for foot pain and was referred to a podiatrist. She has been told that she has stress fractures in her feet. The podiatrist did a vitamin D 25-hydroxy level and it was very low at 8.7. She is requesting treatment for vitamin D deficiency. She tells me she had a bone density about 2 or 3 years ago and she thinks the results were okay. She tells me the podiatrist injected both of her feet and the pain has resolved.  Outpatient Prescriptions Prior to Visit  Medication Sig Dispense Refill  . Estradiol Acetate 0.05 MG/24HR RING Place 1 each vaginally every 3 (three) months. 0.9 each 4  . ESTRING 2 MG vaginal ring PLACE 2 MG VAGINALLY EVERY 3 (THREE) MONTHS. FOLLOW PACKAGE DIRECTIONS 1 each 3  . Flibanserin (ADDYI) 100 MG TABS Take 1 tablet by mouth at bedtime. 30 tablet 12  . terconazole (TERAZOL 7) 0.4 % vaginal cream Place 1 applicator vaginally at bedtime. 45 g 0  . traMADol (ULTRAM) 50 MG tablet Take 1 tablet (50 mg total) by mouth every 8 (eight) hours as needed. 40 tablet 1   No facility-administered medications prior to visit.    ROS Review of Systems  Constitutional: Negative.   HENT: Negative.   Eyes: Negative.   Respiratory: Negative.  Negative for cough, choking, chest tightness, shortness of breath and stridor.   Cardiovascular: Negative.  Negative for chest pain and leg swelling.  Gastrointestinal: Negative.  Negative for nausea, vomiting, diarrhea, constipation and blood in stool.  Endocrine: Negative.   Genitourinary: Negative.  Negative for dysuria, urgency, frequency, decreased urine volume, enuresis and difficulty urinating.  Musculoskeletal: Negative.  Negative for myalgias, back pain, joint swelling and arthralgias.  Skin: Negative.  Negative for  color change, pallor and rash.  Allergic/Immunologic: Negative.   Neurological: Negative.  Negative for dizziness, tremors, syncope, light-headedness, numbness and headaches.  Hematological: Negative.  Negative for adenopathy. Does not bruise/bleed easily.  Psychiatric/Behavioral: Negative.     Objective:  Ht 5\' 3"  (1.6 m)  Wt 217 lb (98.431 kg)  BMI 38.45 kg/m2  BP Readings from Last 3 Encounters:  05/30/15 126/89  05/23/15 110/80  12/14/14 118/80    Wt Readings from Last 3 Encounters:  06/28/15 217 lb (98.431 kg)  05/30/15 218 lb (98.884 kg)  05/23/15 218 lb (98.884 kg)    Physical Exam  Constitutional: She is oriented to person, place, and time. She appears well-developed and well-nourished. No distress.  HENT:  Mouth/Throat: Oropharynx is clear and moist. No oropharyngeal exudate.  Eyes: Conjunctivae are normal. Right eye exhibits no discharge. Left eye exhibits no discharge. No scleral icterus.  Neck: Normal range of motion. Neck supple. No JVD present. No tracheal deviation present. No thyromegaly present.  Cardiovascular: Normal rate, regular rhythm, normal heart sounds and intact distal pulses.  Exam reveals no gallop and no friction rub.   No murmur heard. Pulmonary/Chest: Effort normal and breath sounds normal. No stridor. No respiratory distress. She has no wheezes. She has no rales. She exhibits no tenderness.  Abdominal: Soft. Bowel sounds are normal. She exhibits no distension and no mass. There is no tenderness. There is no rebound and no guarding.  Musculoskeletal: Normal range of motion. She exhibits no edema or tenderness.  Lymphadenopathy:    She has no cervical adenopathy.  Neurological: She is oriented to person, place, and time.  Skin: Skin is warm and dry. No rash noted. She is not diaphoretic. No erythema. No pallor.  Psychiatric: She has a normal mood and affect. Her behavior is normal. Judgment and thought content normal.  Vitals reviewed.   Lab  Results  Component Value Date   WBC 9.0 06/28/2015   HGB 12.3 06/28/2015   HCT 37.3 06/28/2015   PLT 313.0 06/28/2015   GLUCOSE 95 06/28/2015   CHOL 217* 06/28/2015   TRIG 151.0* 06/28/2015   HDL 62.10 06/28/2015   LDLDIRECT 149.6 06/02/2012   LDLCALC 125* 06/28/2015   ALT 16 06/28/2015   AST 11 06/28/2015   NA 139 06/28/2015   K 3.8 06/28/2015   CL 103 06/28/2015   CREATININE 0.69 06/28/2015   BUN 10 06/28/2015   CO2 31 06/28/2015   TSH 0.87 06/28/2015   HGBA1C 5.9 06/28/2015    Dg Foot Complete Left  05/23/2015  CLINICAL DATA:  Generalized foot pain and swelling for the past week without known injury EXAM: LEFT FOOT - COMPLETE 3+ VIEW COMPARISON:  Left foot series of October 07, 2009 FINDINGS: The bones of the left foot are adequately mineralized. The joint spaces are well maintained. There is no acute or healing fracture. There is no lytic nor blastic lesion. There are plantar and Achilles region calcaneal spurs. The soft tissues exhibit no acute abnormalities other than mild diffuse swelling. IMPRESSION: There is no acute bony abnormality of the left foot. Mild diffuse soft tissue swelling is observed. Electronically Signed   By: David  [place] M.D.   On: 05/23/2015 14:01    Assessment & Plan:   Lori Booth was seen today for foot pain.  Diagnoses and all orders for this visit:  Visit for screening mammogram -     MM DIGITAL SCREENING BILATERAL; Future  Hyperglycemia- her A1c is 5.9%, she is prediabetic, no medications are needed at this time, she will work on her lifestyle modifications with diet/exercise/weight loss. -     Hemoglobin A1c; Future  Gastroesophageal reflux disease without esophagitis- she has had no recent episodes of heartburn and doesn't feel like she needs a medication for this.  Routine general medical examination at a health care facility- exam completed, labs ordered and reviewed, her Pap smear is up-to-date, she was referred for a mammogram, her  colonoscopy is up-to-date, patient education material was given. -     Lipid panel; Future -     Comprehensive metabolic panel; Future -     CBC with Differential/Platelet; Future -     TSH; Future  Vitamin D deficiency- I will treat deficiency with vitamin D supplement and have ordered a bone mineral density to screen for osteoporosis. -     Cholecalciferol 2000 units TABS; Take 1 tablet (2,000 Units total) by mouth daily. With a meal or glass of milk. -     DG Bone Density; Future   I am having Ms. Colpitts start on Cholecalciferol. I am also having her maintain her ESTRING, traMADol, Estradiol Acetate, Flibanserin, and terconazole.  Meds ordered this encounter  Medications  . Cholecalciferol 2000 units TABS    Sig: Take 1 tablet (2,000 Units total) by mouth daily. With a meal or glass of milk.    Dispense:  90 tablet    Refill:  3     Follow-up: Return in about 3 months (around 09/27/2015).  Sanda Linger, MD

## 2015-06-28 NOTE — Patient Instructions (Signed)
Vitamin D Deficiency Vitamin D deficiency is when your body does not have enough vitamin D. Vitamin D is important to your body for many reasons:  It helps the body to absorb two important minerals, called calcium and phosphorus.  It plays a role in bone health.  It may help to prevent some diseases, such as diabetes and multiple sclerosis.  It plays a role in muscle function, including heart function. You can get vitamin D by:  Eating foods that naturally contain vitamin D.  Eating or drinking milk or other dairy products that have vitamin D added to them.  Taking a vitamin D supplement or a multivitamin supplement that contains vitamin D.  Being in the sun. Your body naturally makes vitamin D when your skin is exposed to sunlight. Your body changes the sunlight into a form of the vitamin that the body can use. If vitamin D deficiency is severe, it can cause a condition in which your bones become soft. In adults, this condition is called osteomalacia. In children, this condition is called rickets. CAUSES Vitamin D deficiency may be caused by:  Not eating enough foods that contain vitamin D.  Not getting enough sun exposure.  Having certain digestive system diseases that make it difficult for your body to absorb vitamin D. These diseases include Crohn disease, chronic pancreatitis, and cystic fibrosis.  Having a surgery in which a part of the stomach or a part of the small intestine is removed.  Being obese.  Having chronic kidney disease or liver disease. RISK FACTORS This condition is more likely to develop in:  Older people.  People who do not spend much time outdoors.  People who live in a long-term care facility.  People who have had broken bones.  People with weak or thin bones (osteoporosis).  People who have a disease or condition that changes how the body absorbs vitamin D.  People who have dark skin.  People who take certain medicines, such as steroid  medicines or certain seizure medicines.  People who are overweight or obese. SYMPTOMS In mild cases of vitamin D deficiency, there may not be any symptoms. If the condition is severe, symptoms may include:  Bone pain.  Muscle pain.  Falling often.  Broken bones caused by a minor injury. DIAGNOSIS This condition is usually diagnosed with a blood test.  TREATMENT Treatment for this condition may depend on what caused the condition. Treatment options include:  Taking vitamin D supplements.  Taking a calcium supplement. Your health care provider will suggest what dose is best for you. HOME CARE INSTRUCTIONS  Take medicines and supplements only as told by your health care provider.  Eat foods that contain vitamin D. Choices include:  Fortified dairy products, cereals, or juices. Fortified means that vitamin D has been added to the food. Check the label on the package to be sure.  Fatty fish, such as salmon or trout.  Eggs.  Oysters.  Do not use a tanning bed.  Maintain a healthy weight. Lose weight, if needed.  Keep all follow-up visits as told by your health care provider. This is important. SEEK MEDICAL CARE IF:  Your symptoms do not go away.  You feel like throwing up (nausea) or you throw up (vomit).  You have fewer bowel movements than usual or it is difficult for you to have a bowel movement (constipation).   This information is not intended to replace advice given to you by your health care provider. Make sure you discuss   any questions you have with your health care provider.   Document Released: 05/13/2011 Document Revised: 11/09/2014 Document Reviewed: 07/06/2014 Elsevier Interactive Patient Education 2016 [employer]  

## 2015-06-29 ENCOUNTER — Encounter: Payer: Self-pay | Admitting: Internal Medicine

## 2015-07-03 ENCOUNTER — Telehealth: Payer: Self-pay | Admitting: Genetic Counselor

## 2015-07-03 NOTE — Telephone Encounter (Signed)
Pt called in to cancel her genetic counseling appt and will call back when ready to schedule appt.. Referring provider was notified

## 2015-07-06 ENCOUNTER — Encounter: Payer: BLUE CROSS/BLUE SHIELD | Admitting: Genetic Counselor

## 2015-07-06 ENCOUNTER — Other Ambulatory Visit: Payer: BLUE CROSS/BLUE SHIELD

## 2015-07-18 ENCOUNTER — Ambulatory Visit
Admission: RE | Admit: 2015-07-18 | Discharge: 2015-07-18 | Disposition: A | Discharge status: Home / Self Care | Payer: BLUE CROSS/BLUE SHIELD | Source: Ambulatory Visit | Attending: Internal Medicine | Admitting: Internal Medicine

## 2015-07-18 DIAGNOSIS — Z1231 Encounter for screening mammogram for malignant neoplasm of breast: Secondary | ICD-10-CM

## 2015-07-25 LAB — HM MAMMOGRAPHY

## 2015-07-25 NOTE — Addendum Note (Signed)
Addended by: Etta GrandchildJONES, Deshara Rossi L on: 07/25/2015 01:51 PM   Modules accepted: K[employer] BroodSmartSet

## 2016-02-20 ENCOUNTER — Other Ambulatory Visit: Payer: Self-pay | Admitting: Specialist

## 2016-03-14 ENCOUNTER — Ambulatory Visit
Admission: RE | Admit: 2016-03-14 | Discharge: 2016-03-14 | Disposition: A | Discharge status: Home / Self Care | Payer: BLUE CROSS/BLUE SHIELD | Source: Ambulatory Visit | Attending: Specialist | Admitting: Specialist

## 2016-09-26 ENCOUNTER — Encounter: Payer: Self-pay | Admitting: Family Medicine

## 2016-09-26 ENCOUNTER — Ambulatory Visit (INDEPENDENT_AMBULATORY_CARE_PROVIDER_SITE_OTHER): Payer: BLUE CROSS/BLUE SHIELD | Admitting: Family Medicine

## 2016-09-26 VITALS — BP 110/80 | HR 82 | Temp 97.8°F | Ht 63.0 in | Wt 218.0 lb

## 2016-09-26 DIAGNOSIS — K219 Gastro-esophageal reflux disease without esophagitis: Secondary | ICD-10-CM

## 2016-09-26 MED ORDER — OMEPRAZOLE 20 MG PO CPDR: 20.0000 mg | DELAYED_RELEASE_CAPSULE | Freq: Every day | ORAL | 0 refills | Status: DC

## 2016-09-26 NOTE — Assessment & Plan Note (Addendum)
No DM. Has HLD and is obese. Reviewed her lab work today - Encourage weight loss - Initiated omeprazole for a trial of 4 weeks and can consider stopping and she has she does from there. - Given indications to return

## 2016-09-26 NOTE — Progress Notes (Signed)
Lori DuverneySharon Booth - 56 y.o. female MRN [id number]  Date of birth: April 09, 1960  SUBJECTIVE:  Including CC & ROS.  Chief Complaint  Patient presents with  . Gastroesophageal Reflux    X1week, constant pain not going away no matter what she takes.   Lori Booth is a 56 year old female is presenting with indigestion. She reports this is been ongoing for about 1 week. She denies any new medications. The pain has become more constant. She denies that is related to position or food. She has not had any medications that she has used for this. She has had some nausea. She gave up caffeine about 2 weeks ago. She eats at home as well as at fast food. She has lost a couple of pounds recently. She reports normal bowel movements. She denies any abdominal pain. She eats normally around 8 PM and goes to bed around 10 PM.     Review of Systems  Constitutional: Negative for appetite change, chills and fever.  Respiratory: Negative for shortness of breath.   Cardiovascular: Negative for chest pain.  Gastrointestinal: Positive for nausea. Negative for abdominal distention, abdominal pain, constipation, diarrhea and vomiting.  Otherwise negative  HISTORY: Past Medical, Surgical, Social, and Family History Reviewed & Updated per EMR.   Pertinent Historical Findings include:  Past Medical History:  Diagnosis Date  . GERD (gastroesophageal reflux disease)   . History of frequent urinary tract infections    Per pt, over the past 15 months, has had 7 UTI's    Past Surgical History:  Procedure Laterality Date  . CESAREAN SECTION    . cyrocautery    . TUBAL LIGATION      Allergies  Allergen Reactions  . Naproxen Other (See Comments)    Severe GERD    Family History  Problem Relation Age of Onset  . Stroke Father   . Heart disease Father   . Hyperlipidemia Father   . Arthritis Mother   . Diabetes Sister   . Cancer Neg Hx   . Alcohol abuse Neg Hx   . Drug abuse Neg Hx   . Early death Neg Hx   .  Hearing loss Neg Hx   . Hypertension Neg Hx   . Kidney disease Neg Hx      Social History   Social History  . Marital status: Single    Spouse name: N/A  . Number of children: N/A  . Years of education: N/A   Occupational History  . Not on file.   Social History Main Topics  . Smoking status: Never Smoker  . Smokeless tobacco: Never Used  . Alcohol use No     Comment: social  . Drug use: No  . Sexual activity: Yes    Partners: Male    Birth control/ protection: Post-menopausal   Other Topics Concern  . Not on file   Social History Narrative  . No narrative on file     PHYSICAL EXAM:  VS: BP 110/80 (BP Location: Left Arm, Patient Position: Sitting, Cuff Size: Normal)   Pulse 82   Temp 97.8 F (36.6 C) (Oral)   Ht 5\' 3"  (1.6 m)   Wt 218 lb (98.9 kg)   SpO2 99%   BMI 38.62 kg/m  Physical Exam Gen: NAD, alert, cooperative with exam, well-appearing ENT: normal lips, normal nasal mucosa,  Eye: PERRL, normal conjunctiva and lids CV:  no edema, +2 pedal pulses   Resp: no accessory muscle use, non-labored,  GI: no masses or tenderness,  no hernia, soft, normal bowel sounds,  Skin: no rashes, no areas of induration  Neuro: +2 patellar DTR's, normal sensation to touch Psych:  normal insight, alert and oriented MSK: normal gait, no digit swelling    ASSESSMENT & PLAN:   GERD (gastroesophageal reflux disease) No DM. Has HLD and is obese. Reviewed her lab work today - Encourage weight loss - Initiated omeprazole for a trial of 4 weeks and can consider stopping and she has she does from there. - Given indications to return

## 2016-09-26 NOTE — Patient Instructions (Signed)
Thank you for coming in,   I have sent in omeprazole that he can try for the next 4 weeks. Please follow-up with her primary doctor to see if this is improving or not.    Please feel free to call with any questions or concerns at any time, at [phone]. --Dr. Jordan LikesSchmitz

## 2017-01-03 ENCOUNTER — Encounter: Payer: Self-pay | Admitting: Family Medicine

## 2017-01-03 ENCOUNTER — Ambulatory Visit (INDEPENDENT_AMBULATORY_CARE_PROVIDER_SITE_OTHER): Payer: BLUE CROSS/BLUE SHIELD | Admitting: Family Medicine

## 2017-01-03 VITALS — BP 138/78 | HR 68 | Temp 97.8°F | Ht 63.0 in | Wt 215.0 lb

## 2017-01-03 DIAGNOSIS — M25562 Pain in left knee: Secondary | ICD-10-CM

## 2017-01-03 MED ORDER — DICLOFENAC SODIUM 2 % TD SOLN: 1.0000 "application " | Freq: Two times a day (BID) | TRANSDERMAL | 3 refills | Status: DC

## 2017-01-03 NOTE — Assessment & Plan Note (Signed)
Pain is likely related to combination of underlying arthritic change of patellofemoral syndrome. - Provided Pennsaid today - Counseled on over-the-counter supplements to try - Ice and compression - Counseled on home exercise therapy - If no improvement can consider x-ray and injection and/or referral to physical therapy.

## 2017-01-03 NOTE — Patient Instructions (Addendum)
Thank you for coming in,   Please try ice and compression.   Please try tylenol   Please try the rub on medicine.    Please feel free to call with any questions or concerns at any time, at [phone]. --Dr. Jordan LikesSchmitz  Take tylenol 650 mg three times a day is the best evidence based medicine we have for arthritis.   Glucosamine sulfate 750mg  twice a day is a supplement that has been shown to help moderate to severe arthritis.  Vitamin D 2000 IU daily  Fish oil 2 grams daily.   Tumeric 500mg  twice daily.   Capsaicin topically up to four times a day may also help with pain. Cortisone injections are an option if these interventions do not seem to make a difference or need more relief.   If cortisone injections do not help, there are different types of shots that may help but they take longer to take effect.  We can discuss this at follow up.  It's important that you continue to stay active.  Controlling your weight is important.   Consider physical therapy to strengthen muscles around the joint that hurts to take pressure off of the joint itself.  Shoe inserts with good arch support may be helpful.  [employer] could help.   Water aerobics and cycling with low resistance are the best two types of exercise for arthritis.  Come back and see me in 3 weeks.

## 2017-01-03 NOTE — Progress Notes (Signed)
Lori DuverneySharon Booth - 56 y.o. female MRN [id number]  Date of birth: [birth year]  SUBJECTIVE:  Including CC & ROS.  Chief Complaint  Patient presents with  . Left knee swelling    has been present for 2 + months. Denies injury. Constant pain, described as aching pain.     Lori Booth is a 56 y.o. female that is  presenting with left knee pain. The pain is lateral and anterior nature. She does have swelling on the knee. There is popping at times. Pain is worse with getting up from a seated position. Also in the morning it is significantly painful. The pain is throbbing. Has not taken any medications for this as she is allergic to ibuprofen. Denies any previous injury or surgery. The pain seems to be staying the same. There is no radiation.   Review of Systems  Constitutional: Negative for fever.  Musculoskeletal: Positive for arthralgias and joint swelling. Negative for gait problem.    HISTORY: Past Medical, Surgical, Social, and Family History Reviewed & Updated per EMR.   Pertinent Historical Findings include:  Past Medical History:  Diagnosis Date  . GERD (gastroesophageal reflux disease)   . History of frequent urinary tract infections    Per pt, over the past 15 months, has had 7 UTI's    Past Surgical History:  Procedure Laterality Date  . CESAREAN SECTION    . cyrocautery    . TUBAL LIGATION      Allergies  Allergen Reactions  . Naproxen Other (See Comments)    Severe GERD    Family History  Problem Relation Age of Onset  . Stroke Father   . Heart disease Father   . Hyperlipidemia Father   . Arthritis Mother   . Diabetes Sister   . Cancer Neg Hx   . Alcohol abuse Neg Hx   . Drug abuse Neg Hx   . Early death Neg Hx   . Hearing loss Neg Hx   . Hypertension Neg Hx   . Kidney disease Neg Hx      Social History   Social History  . Marital status: Single    Spouse name: N/A  . Number of children: N/A  . Years of education: N/A   Occupational History  . Not on  file.   Social History Main Topics  . Smoking status: Never Smoker  . Smokeless tobacco: Never Used  . Alcohol use No     Comment: social  . Drug use: No  . Sexual activity: Yes    Partners: Male    Birth control/ protection: Post-menopausal   Other Topics Concern  . Not on file   Social History Narrative  . No narrative on file     PHYSICAL EXAM:  VS: BP 138/78 (BP Location: Left Arm, Patient Position: Sitting, Cuff Size: Normal)   Pulse 68   Temp 97.8 F (36.6 C) (Oral)   Ht 5\' 3"  (1.6 m)   Wt 215 lb (97.5 kg)   SpO2 99%   BMI 38.09 kg/m  Physical Exam Gen: NAD, alert, cooperative with exam, well-appearing ENT: normal lips, normal nasal mucosa,  Eye: normal EOM, normal conjunctiva and lids CV:  no edema, +2 pedal pulses   Resp: no accessory muscle use, non-labored,  GI: no masses or tenderness, no hernia  Skin: no rashes, no areas of induration  Neuro: normal tone, normal sensation to touch Psych:  normal insight, alert and oriented MSK:  Left knee: No significant tenderness of the medial  joint line. Some tenderness to palpation lateral joint line. Mild effusion. Normal patellar and quad tendon. Normal strength resistance with knee flexion and extension. Normal end points with valgus and varus testing. No pain with patellar grind compression. Normal gait. Neurovascularly intact.  Limited ultrasound: Left knee:  Moderate effusion in the suprapatellar pouch Medial joint space as outpouching of the medial meniscus and narrowing of the joint line Lateral joint line is fairly well-preserved  Summary: Findings are consistent with arthritic change.  Ultrasound and interpretation by Clare Gandy, MD        ASSESSMENT & PLAN:   Acute pain of left knee Pain is likely related to combination of underlying arthritic change of patellofemoral syndrome. - Provided Pennsaid today - Counseled on over-the-counter supplements to try - Ice and compression -  Counseled on home exercise therapy - If no improvement can consider x-ray and injection and/or referral to physical therapy.

## 2017-04-29 ENCOUNTER — Telehealth: Payer: Self-pay | Admitting: Internal Medicine

## 2017-04-29 NOTE — Telephone Encounter (Signed)
Rec'd from [facility] forwarded 3 pages to Dr. Jones Thomas °

## 2017-06-21 ENCOUNTER — Ambulatory Visit (HOSPITAL_COMMUNITY)
Admission: EM | Admit: 2017-06-21 | Discharge: 2017-06-21 | Disposition: A | Discharge status: Home / Self Care | Payer: BLUE CROSS/BLUE SHIELD | Attending: Family Medicine | Admitting: Family Medicine

## 2017-06-21 ENCOUNTER — Encounter (HOSPITAL_COMMUNITY): Payer: Self-pay | Admitting: Emergency Medicine

## 2017-06-21 DIAGNOSIS — L02213 Cutaneous abscess of chest wall: Secondary | ICD-10-CM | POA: Diagnosis not present

## 2017-06-21 DIAGNOSIS — L0291 Cutaneous abscess, unspecified: Secondary | ICD-10-CM

## 2017-06-21 NOTE — ED Provider Notes (Signed)
[facility]    CSN: [id number] Arrival date & time: 06/21/17  1202     History   Chief Complaint Chief Complaint  Patient presents with  . Cyst    HPI Lori Booth is a 57 y.o. female.   Lori Booth presents with complaints of area in between her breast which has become swollen and tender over the past two days. No drainage. States has had similar in the past which resolved without any treatment. Painful. Slight headache. No known fevers. No known MRSA hx. History of gerd but otherwise without contributing medical history.   ROS per HPI.      Past Medical History:  Diagnosis Date  . GERD (gastroesophageal reflux disease)   . History of frequent urinary tract infections    Per pt, over the past 15 months, has had 7 UTI's    Patient Active Problem List   Diagnosis Date Noted  . Vitamin D deficiency 06/28/2015  . 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 Acute pain of left knee 04/22/2014  . GERD (gastroesophageal reflux disease) 08/13/2013  . Routine general medical examination at a health care facility 06/03/2013  . Visit for screening mammogram 06/03/2013  . Hyperglycemia 06/02/2012  . Hyperlipidemia LDL goal < 130 06/02/2012  . Abnormal renal finding 06/02/2012    Past Surgical History:  Procedure Laterality Date  . BARIATRIC SURGERY    . CESAREAN SECTION    . cyrocautery    . TUBAL LIGATION      OB History   None      Home Medications    Prior to Admission medications   Medication Sig Start Date End Date Taking? Authorizing Provider  pantoprazole (PROTONIX) 40 MG tablet Take 40 mg by mouth daily.   Yes [provider]  Cholecalciferol 2000 units TABS Take 1 tablet (2,000 Units total) by mouth daily. With a meal or glass of milk. Patient not taking: Reported on 06/21/2017 06/28/15   Etta GrandchildJones, Thomas L, MD  Diclofenac Sodium (PENNSAID) 2 % SOLN Place 1 application onto the skin 2 (two) times daily. Patient not taking: Reported on  06/21/2017 01/03/17   Myra RudeSchmitz, Jeremy E, MD  omeprazole (PRILOSEC) 20 MG capsule Take 1 capsule (20 mg total) by mouth daily. Patient not taking: Reported on 06/21/2017 09/26/16   Myra RudeSchmitz, Jeremy E, MD  traMADol (ULTRAM) 50 MG tablet Take 1 tablet (50 mg total) by mouth every 8 (eight) hours as needed. Patient not taking: Reported on 06/21/2017 05/23/15   Etta GrandchildJones, Thomas L, MD    Family History Family History  Problem Relation Age of Onset  . Stroke Father   . Heart disease Father   . Hyperlipidemia Father   . Arthritis Mother   . Diabetes Sister   . Cancer Neg Hx   . Alcohol abuse Neg Hx   . Drug abuse Neg Hx   . Early death Neg Hx   . Hearing loss Neg Hx   . Hypertension Neg Hx   . Kidney disease Neg Hx     Social History Social History   Tobacco Use  . Smoking status: Never Smoker  . Smokeless tobacco: Never Used  Substance Use Topics  . Alcohol use: No    Alcohol/week: 0.0 oz    Comment: social  . Drug use: No     Allergies   Naproxen   Review of Systems Review of Systems   Physical Exam Triage Vital Signs ED Triage Vitals [06/21/17 1231]  Enc Vitals Group  BP 105/71     Pulse Rate 61     Resp 16     Temp (!) 97.3 F (36.3 C)     Temp Source Oral     SpO2 98 %     Weight      Height      Head Circumference      Peak Flow      Pain Score      Pain Loc      Pain Edu?      Excl. in GC?    No data found.  Updated Vital Signs BP 105/71 (BP Location: Left Arm)   Pulse 61   Temp (!) 97.3 F (36.3 C) (Oral)   Resp 16   SpO2 98%   Visual Acuity Right Eye Distance:   Left Eye Distance:   Bilateral Distance:    Right Eye Near:   Left Eye Near:    Bilateral Near:     Physical Exam  Constitutional: She is oriented to person, place, and time. She appears well-developed and well-nourished. No distress.  Cardiovascular: Normal rate, regular rhythm and normal heart sounds.  Pulmonary/Chest: Effort normal and breath sounds normal.  Approximately  0.5 cm in diameter raised fluctuant abscess; without surrounding tissue redness; no active drainage     Neurological: She is alert and oriented to person, place, and time.  Skin: Skin is warm and dry.     UC Treatments / Results  Labs (all labs ordered are listed, but only abnormal results are displayed) Labs Reviewed - No data to display  EKG None Radiology No results found.  Procedures Incision and Drainage Date/Time: 06/21/2017 1:29 PM Performed by: Georgetta Haber, NP Authorized by: Sharlene Dory, DO   Consent:    Consent obtained:  Verbal   Consent given by:  Patient   Risks discussed:  Bleeding, incomplete drainage, pain and infection   Alternatives discussed:  No treatment, alternative treatment, observation and referral Location:    Type:  Abscess   Size:  0.5 cm   Location:  Trunk   Trunk location:  Chest (right sternum ) Pre-procedure details:    Skin preparation:  Betadine Anesthesia (see MAR for exact dosages):    Anesthesia method:  Topical application Procedure type:    Complexity:  Simple Procedure details:    Incision types:  Stab incision   Scalpel blade:  11   Drainage:  Purulent and bloody   Drainage amount:  Scant   Wound treatment:  Wound left open   Packing materials:  None Post-procedure details:    Patient tolerance of procedure:  Tolerated well, no immediate complications Comments:     Small abscess vs irritated cyst with slight purulent drainage with stab incision after topical spray anesthetic. Gauze dressing applied    (including critical care time)  Medications Ordered in UC Medications - No data to display   Initial Impression / Assessment and Plan / UC Course  I have reviewed the triage vital signs and the nursing notes.  Pertinent labs & imaging results that were available during my care of the patient were reviewed by me and considered in my medical decision making (see chart for details).     Wound care  instructions provided. No indications for antibiotics at this time. Discussed cyst vs abscess and follow up with PCP. Patient verbalized understanding and agreeable to plan.    Final Clinical Impressions(s) / UC Diagnoses   Final diagnoses:  Abscess    ED  Discharge Orders    None       Controlled Substance Prescriptions [place] Controlled Substance Registry consulted? Not Applicable   Georgetta Haber, NP 06/21/17 1332

## 2017-06-21 NOTE — ED Triage Notes (Signed)
Pt c/o cyst inbetween breasts x2 days.

## 2017-06-21 NOTE — Discharge Instructions (Addendum)
Warm compresses to the area to promote drainage, 2-3 times a day. Keep covered to keep clean. May wash with soap and water daily. If develop signs of infection- increased pain, fevers, drainage, redness or swelling please be seen again. If persistent or no improvement please continue to follow with your primary care provider as may need further intervention.

## 2017-08-07 ENCOUNTER — Ambulatory Visit (INDEPENDENT_AMBULATORY_CARE_PROVIDER_SITE_OTHER): Payer: BLUE CROSS/BLUE SHIELD | Admitting: Certified Nurse Midwife

## 2017-08-07 ENCOUNTER — Encounter: Payer: Self-pay | Admitting: Certified Nurse Midwife

## 2017-08-07 VITALS — BP 117/73 | HR 67 | Ht 63.0 in | Wt 163.8 lb

## 2017-08-07 DIAGNOSIS — Z01419 Encounter for gynecological examination (general) (routine) without abnormal findings: Secondary | ICD-10-CM | POA: Diagnosis not present

## 2017-08-07 DIAGNOSIS — Z79890 Hormone replacement therapy (postmenopausal): Secondary | ICD-10-CM | POA: Diagnosis not present

## 2017-08-07 DIAGNOSIS — Z124 Encounter for screening for malignant neoplasm of cervix: Secondary | ICD-10-CM

## 2017-08-07 DIAGNOSIS — Z1151 Encounter for screening for human papillomavirus (HPV): Secondary | ICD-10-CM

## 2017-08-07 LAB — HM PAP SMEAR

## 2017-08-07 MED ORDER — ESTRADIOL ACETATE 0.1 MG/24HR VA RING: 1.0000 | VAGINAL_RING | VAGINAL | 12 refills | Status: DC

## 2017-08-07 MED ORDER — OSPEMIFENE 60 MG PO TABS: 1.0000 | ORAL_TABLET | Freq: Every day | ORAL | 12 refills | Status: DC

## 2017-08-07 NOTE — Progress Notes (Signed)
Pt is here for annual gyn exam. G1P1. BTL. Needs pap today, last pap 05/30/15 normal -hpv. Pt states she needs to schedule her MMG, Pt reports normal colonoscopy 3 years ago.

## 2017-08-07 NOTE — Progress Notes (Signed)
Subjective:        Lori Booth is a 57 y.o. female here for a routine exam.  Current complaints: none.  Has been using femring.  Denies any hotflashes.  Currently sexually active with low libido with her boyfriend.  Does not desire to try Addi.   No hx of abnormal pap smears.  Postmenopausal since the age of 57 years old.   Declines STD screening today.    Personal health questionnaire:  Is patient [name], have a family history of breast and/or ovarian cancer: no Is there a family history of uterine cancer diagnosed at age < 90, gastrointestinal cancer, urinary tract cancer, family member who is a Personnel officer syndrome-associated carrier: yes, uterine cancer, father deceased of kidney cancer Is the patient overweight and hypertensive, family history of diabetes, personal history of gestational diabetes, preeclampsia or PCOS: yes Is patient over 93, have PCOS,  family history of premature CHD under age 35, diabetes, smoke, have hypertension or peripheral artery disease:  yes At any time, has a partner hit, kicked or otherwise hurt or frightened you?: no Over the past 2 weeks, have you felt down, depressed or hopeless?: no Over the past 2 weeks, have you felt little interest or pleasure in doing things?:no   Gynecologic History No LMP recorded. Patient is postmenopausal. Contraception: post menopausal status, BTL Last Pap: 05/30/15. Results were: normal Last mammogram: 07/18/15. Results were: normal  Obstetric History OB History  Gravida Para Term Preterm AB Living  1 1 1     1   SAB TAB Ectopic Multiple Live Births          1    # Outcome Date GA Lbr Len/2nd Weight Sex Delivery Anes PTL Lv  1 Term 09/10/82 [redacted]w[redacted]d   M CS-LTranv  N LIV    Past Medical History:  Diagnosis Date  . GERD (gastroesophageal reflux disease)   . History of frequent urinary tract infections    Per pt, over the past 15 months, has had 7 UTI's    Past Surgical History:  Procedure Laterality Date  .  BARIATRIC SURGERY    . CESAREAN SECTION    . cyrocautery    . TUBAL LIGATION       Current Outpatient Medications:  .  Cholecalciferol 2000 units TABS, Take 1 tablet (2,000 Units total) by mouth daily. With a meal or glass of milk., Disp: 90 tablet, Rfl: 3 .  pantoprazole (PROTONIX) 40 MG tablet, Take 40 mg by mouth daily., Disp: , Rfl:  .  Estradiol Acetate (FEMRING) 0.1 MG/24HR RING, Place 1 each vaginally every 3 (three) months., Disp: 1 each, Rfl: 12 .  Ospemifene (OSPHENA) 60 MG TABS, Take 1 tablet by mouth daily., Disp: 30 tablet, Rfl: 12 Allergies  Allergen Reactions  . Naproxen Other (See Comments)    Severe GERD    Social History   Tobacco Use  . Smoking status: Never Smoker  . Smokeless tobacco: Never Used  Substance Use Topics  . Alcohol use: No    Alcohol/week: 0.0 oz    Comment: social    Family History  Problem Relation Age of Onset  . Stroke Father   . Heart disease Father   . Hyperlipidemia Father   . Arthritis Mother   . Diabetes Sister   . Cancer Neg Hx   . Alcohol abuse Neg Hx   . Drug abuse Neg Hx   . Early death Neg Hx   . Hearing loss Neg Hx   .  Hypertension Neg Hx   . Kidney disease Neg Hx       Review of Systems  Constitutional: negative for fatigue and + weight loss d/t recent gastric banding Respiratory: negative for cough and wheezing Cardiovascular: negative for chest pain, fatigue and palpitations Gastrointestinal: negative for abdominal pain and change in bowel habits Musculoskeletal:negative for myalgias Neurological: negative for gait problems and tremors Behavioral/Psych: negative for abusive relationship, depression Endocrine: negative for temperature intolerance    Genitourinary:negative for abnormal menstrual periods, genital lesions, +for  hot flashes, + for decreased libido d/t decreased vaginal lubrication and negative for vaginal discharge Integument/breast: negative for breast lump, breast tenderness, nipple discharge and  skin lesion(s)    Objective:       BP 117/73   Pulse 67   Ht 5\' 3"  (1.6 m)   Wt 163 lb 12.8 oz (74.3 kg)   BMI 29.02 kg/m  General:   alert  Skin:   no rash or abnormalities  Lungs:   clear to auscultation bilaterally  Heart:   regular rate and rhythm, S1, S2 normal, no murmur, click, rub or gallop  Breasts:   normal without suspicious masses, skin or nipple changes or axillary nodes  Abdomen:  normal findings: no organomegaly, soft, non-tender and no hernia  Pelvis:  External genitalia: normal general appearance Urinary system: urethral meatus normal and bladder without fullness, nontender Vaginal: normal without tenderness, induration or masses, rugae absent Cervix: normal appearance Adnexa: normal bimanual exam Uterus: anteverted and non-tender, normal size   Lab Review Urine pregnancy test Labs reviewed yes Radiologic studies reviewed yes  50% of 30 min visit spent on counseling and coordination of care.   Assessment & Plan    Healthy female exam.    1. Well woman exam with routine gynecological exam    - Cytology - PAP - MM DIGITAL SCREENING BILATERAL; Future  2. Postmenopausal hormone therapy     - Estradiol Acetate (FEMRING) 0.1 MG/24HR RING; Place 1 each vaginally every 3 (three) months.  Dispense: 1 each; Refill: 12   Education reviewed: calcium supplements, depression evaluation, low fat, low cholesterol diet, safe sex/STD prevention, self breast exams, skin cancer screening and weight bearing exercise. Contraception: post menopausal status and tubal ligation. Hormone replacement therapy: hormone replacement therapy: Femrin and Osphena samples given. Mammogram ordered. Follow up in: 1 year.   Meds ordered this encounter  Medications  . Ospemifene (OSPHENA) 60 MG TABS    Sig: Take 1 tablet by mouth daily.    Dispense:  30 tablet    Refill:  12  . Estradiol Acetate (FEMRING) 0.1 MG/24HR RING    Sig: Place 1 each vaginally every 3 (three) months.     Dispense:  1 each    Refill:  12   Orders Placed This Encounter  Procedures  . MM DIGITAL SCREENING BILATERAL    Standing Status:   Future    Standing Expiration Date:   10/08/2018    Order Specific Question:   Reason for Exam (SYMPTOM  OR DIAGNOSIS REQUIRED)    Answer:   routiene exam    Order Specific Question:   Is the patient pregnant?    Answer:   No    Order Specific Question:   Preferred imaging location?    Answer:   [facility]    Possible management options include:Mona Lisa Touch vaginal rejuvination, systemic HRT, gabapentin/effexor for hot flashes Follow up as needed.

## 2017-08-12 LAB — CYTOLOGY - PAP
Diagnosis: NEGATIVE
HPV: NOT DETECTED

## 2017-08-13 ENCOUNTER — Other Ambulatory Visit: Payer: Self-pay | Admitting: Certified Nurse Midwife

## 2017-08-13 DIAGNOSIS — Z1231 Encounter for screening mammogram for malignant neoplasm of breast: Secondary | ICD-10-CM

## 2017-09-02 ENCOUNTER — Ambulatory Visit
Admission: RE | Admit: 2017-09-02 | Discharge: 2017-09-02 | Disposition: A | Discharge status: Home / Self Care | Payer: BLUE CROSS/BLUE SHIELD | Source: Ambulatory Visit | Attending: Certified Nurse Midwife | Admitting: Certified Nurse Midwife

## 2017-09-02 DIAGNOSIS — Z1231 Encounter for screening mammogram for malignant neoplasm of breast: Secondary | ICD-10-CM

## 2017-11-20 ENCOUNTER — Encounter: Payer: Self-pay | Admitting: Podiatry

## 2017-11-20 ENCOUNTER — Ambulatory Visit: Payer: BLUE CROSS/BLUE SHIELD | Admitting: Podiatry

## 2017-11-20 VITALS — BP 121/71 | HR 63

## 2017-11-20 DIAGNOSIS — L600 Ingrowing nail: Secondary | ICD-10-CM | POA: Insufficient documentation

## 2017-11-20 MED ORDER — CEPHALEXIN 500 MG PO CAPS: 500.0000 mg | ORAL_CAPSULE | Freq: Three times a day (TID) | ORAL | 0 refills | Status: DC

## 2017-11-20 NOTE — Patient Instructions (Signed)

## 2017-11-24 NOTE — Progress Notes (Signed)
Subjective:   Patient ID: Lori Booth, female   DOB: 57 y.o.   MRN: [id number]   HPI 57 year old female presents the office today for concerns of a painful ingrown toenail to the right big toe, medial aspect is been ongoing for about 2 weeks and the area is painful with pressure in shoes.  Denies any drainage or pus coming from the area.  Otherwise she is doing well she has no other concerns.  Review of Systems  All other systems reviewed and are negative.  Past Medical History:  Diagnosis Date  . GERD (gastroesophageal reflux disease)   . History of frequent urinary tract infections    Per pt, over the past 15 months, has had 7 UTI's    Past Surgical History:  Procedure Laterality Date  . BARIATRIC SURGERY    . CESAREAN SECTION    . cyrocautery    . TUBAL LIGATION       Current Outpatient Medications:  .  Cholecalciferol 2000 units TABS, Take 1 tablet (2,000 Units total) by mouth daily. With a meal or glass of milk., Disp: 90 tablet, Rfl: 3 .  Estradiol Acetate (FEMRING) 0.1 MG/24HR RING, Place 1 each vaginally every 3 (three) months., Disp: 1 each, Rfl: 12 .  Ospemifene (OSPHENA) 60 MG TABS, Take 1 tablet by mouth daily., Disp: 30 tablet, Rfl: 12 .  pantoprazole (PROTONIX) 40 MG tablet, Take 40 mg by mouth daily., Disp: , Rfl:  .  cephALEXin (KEFLEX) 500 MG capsule, Take 1 capsule (500 mg total) by mouth 3 (three) times daily., Disp: 30 capsule, Rfl: 0  Allergies  Allergen Reactions  . Nsaids Other (See Comments)    INFLAMES ESOPHAGUS   . Naproxen Other (See Comments)    Severe GERD       Objective:  Physical Exam  General: AAO x3, NAD  Dermatological: Ingrown is present to the medial aspect of the right hallux toenail with tenderness palpation.  There is localized edema but there is no significant erythema, drainage or pus or any clinical signs of infection noted today.  No open lesions identified otherwise.  Vascular: Dorsalis Pedis artery and Posterior Tibial  artery pedal pulses are 2/4 bilateral with immedate capillary fill time. There is no pain with calf compression, swelling, warmth, erythema.   Neruologic: Grossly intact via light touch bilateral.  Protective threshold with Semmes Wienstein monofilament intact to all pedal sites bilateral.   Musculoskeletal: No gross boney pedal deformities bilateral. No pain, crepitus, or limitation noted with foot and ankle range of motion bilateral. Muscular strength 5/5 in all groups tested bilateral.     Assessment:   Right medial hallux symptomatic ingrown toenail    Plan:  -Treatment options discussed including all alternatives, risks, and complications -Etiology of symptoms were discussed -At this time, the patient is requesting partial nail removal with chemical matricectomy to the symptomatic portion of the nail. Risks and complications were discussed with the patient for which they understand and written consent was obtained. Under sterile conditions a total of 3 mL of a mixture of 2% lidocaine plain and 0.5% Marcaine plain was infiltrated in a hallux block fashion. Once anesthetized, the skin was prepped in sterile fashion. A tourniquet was then applied. Next the medial aspect of hallux nail border was then sharply excised making sure to remove the entire offending nail border. Once the nails were ensured to be removed area was debrided and the underlying skin was intact. There is no purulence identified in the procedure. Next  phenol was then applied under standard conditions and copiously irrigated. Silvadene was applied. A dry sterile dressing was applied. After application of the dressing the tourniquet was removed and there is found to be an immediate capillary refill time to the digit. The patient tolerated the procedure well any complications. Post procedure instructions were discussed the patient for which he verbally understood. Follow-up in one week for nail check or sooner if any problems are to  arise. Discussed signs/symptoms of infection and directed to call the office immediately should any occur or go directly to the emergency room. In the meantime, encouraged to call the office with any questions, concerns, changes symptoms. -Keflex  Vivi BarrackMatthew R Wagoner DPM

## 2017-11-27 ENCOUNTER — Ambulatory Visit (INDEPENDENT_AMBULATORY_CARE_PROVIDER_SITE_OTHER): Payer: Self-pay

## 2017-11-27 ENCOUNTER — Encounter: Payer: Self-pay | Admitting: Podiatry

## 2017-11-27 DIAGNOSIS — L600 Ingrowing nail: Secondary | ICD-10-CM

## 2017-11-27 NOTE — Patient Instructions (Addendum)

## 2017-11-28 NOTE — Progress Notes (Signed)
Patient is here today for follow-up appointment, recent procedure performed 11/20/2017, removal of right medial hallux ingrown nail border.  She states that she is not having any pain and does not have any issues with her toe.  No erythema, no redness, no drainage, no swelling, no other signs and symptoms of infection.  Area appears to be scabbing over at this time and is healing well.  We discussed the signs and symptoms of infection.  Verbal and written instructions were given to the patient.  She is to follow-up with any acute symptom changes.

## 2018-05-14 ENCOUNTER — Encounter (HOSPITAL_COMMUNITY): Payer: Self-pay

## 2018-05-14 ENCOUNTER — Ambulatory Visit (HOSPITAL_COMMUNITY)
Admission: EM | Admit: 2018-05-14 | Discharge: 2018-05-14 | Disposition: A | Discharge status: Home / Self Care | Payer: BLUE CROSS/BLUE SHIELD | Attending: Family Medicine | Admitting: Family Medicine

## 2018-05-14 ENCOUNTER — Other Ambulatory Visit: Payer: Self-pay

## 2018-05-14 DIAGNOSIS — R6889 Other general symptoms and signs: Secondary | ICD-10-CM

## 2018-05-14 MED ORDER — OSELTAMIVIR PHOSPHATE 75 MG PO CAPS: 75.0000 mg | ORAL_CAPSULE | Freq: Two times a day (BID) | ORAL | 0 refills | Status: DC

## 2018-05-14 NOTE — ED Triage Notes (Signed)
Pt presents with general body aches & headache with light sensitivity.

## 2018-05-14 NOTE — Discharge Instructions (Signed)
Please use tylenol or ibuprofen for fever  Please stay well hydrated  Please try honey, vick's vapor rub, lozenges and humidifer for cough and sore throat  Please follow up if your symptoms fail to improve.

## 2018-05-14 NOTE — ED Provider Notes (Signed)
[facility]    CSN: [id number] Arrival date & time: 05/14/18  1851     History   Chief Complaint Chief Complaint  Patient presents with  . Generalized Body Aches  . Headache    HPI Lori Booth is a 58 y.o. female.   She is presenting with headache and body aches.  She has felt warm but knows no recorded temperature.  Has not taken any medications.  She denies any recent travel or rash.  Feels like her symptoms are staying the same.  Has received a flu vaccine.  HPI  Past Medical History:  Diagnosis Date  . GERD (gastroesophageal reflux disease)   . History of frequent urinary tract infections    Per pt, over the past 15 months, has had 7 UTI's    Patient Active Problem List   Diagnosis Date Noted  . Ingrown toenail 11/20/2017  . Vitamin D deficiency 06/28/2015  . 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 Acute pain of left knee 04/22/2014  . GERD (gastroesophageal reflux disease) 08/13/2013  . Routine general medical examination at a health care facility 06/03/2013  . Visit for screening mammogram 06/03/2013  . Hyperglycemia 06/02/2012  . Hyperlipidemia LDL goal < 130 06/02/2012  . Abnormal renal finding 06/02/2012    Past Surgical History:  Procedure Laterality Date  . BARIATRIC SURGERY    . CESAREAN SECTION    . cyrocautery    . TUBAL LIGATION      OB History    Gravida  1   Para  1   Term  1   Preterm      AB      Living  1     SAB      TAB      Ectopic      Multiple      Live Births  1            Home Medications    Prior to Admission medications   Medication Sig Start Date End Date Taking? Authorizing Provider  cephALEXin (KEFLEX) 500 MG capsule Take 1 capsule (500 mg total) by mouth 3 (three) times daily. 11/20/17   Vivi Barrack, DPM  Cholecalciferol 2000 units TABS Take 1 tablet (2,000 Units total) by mouth daily. With a meal or glass of milk. 06/28/15   Etta Grandchild, MD  Estradiol Acetate [facility])  0.1 MG/24HR RING Place 1 each vaginally every 3 (three) months. 08/07/17   Orvilla Cornwall A, CNM  oseltamivir (TAMIFLU) 75 MG capsule Take 1 capsule (75 mg total) by mouth 2 (two) times daily. 05/14/18   Myra Rude, MD  Ospemifene (OSPHENA) 60 MG TABS Take 1 tablet by mouth daily. 08/07/17   Orvilla Cornwall A, CNM  pantoprazole (PROTONIX) 40 MG tablet Take 40 mg by mouth daily.    [provider]    Family History Family History  Problem Relation Age of Onset  . Stroke Father   . Heart disease Father   . Hyperlipidemia Father   . Arthritis Mother   . Diabetes Sister   . Cancer Neg Hx   . Alcohol abuse Neg Hx   . Drug abuse Neg Hx   . Early death Neg Hx   . Hearing loss Neg Hx   . Hypertension Neg Hx   . Kidney disease Neg Hx     Social History Social History   Tobacco Use  . Smoking status: Never Smoker  . Smokeless tobacco: Never Used  Substance Use Topics  . Alcohol use: No    Alcohol/week: 0.0 standard drinks    Comment: social  . Drug use: No     Allergies   Nsaids and Naproxen   Review of Systems Review of Systems  Constitutional: Positive for activity change.  HENT: Positive for sore throat.   Respiratory: Negative for cough.   Cardiovascular: Negative for chest pain.  Gastrointestinal: Negative for abdominal pain.  Musculoskeletal: Positive for myalgias.     Physical Exam Triage Vital Signs ED Triage Vitals  Enc Vitals Group     BP 05/14/18 1946 110/71     Pulse Rate 05/14/18 1946 (!) 104     Resp 05/14/18 1946 18     Temp 05/14/18 1946 99.6 F (37.6 C)     Temp Source 05/14/18 1946 Oral     SpO2 05/14/18 1946 97 %     Weight --      Height --      Head Circumference --      Peak Flow --      Pain Score 05/14/18 2002 7     Pain Loc --      Pain Edu? --      Excl. in GC? --    No data found.  Updated Vital Signs BP 110/71 (BP Location: Left Arm)   Pulse (!) 104   Temp 99.6 F (37.6 C) (Oral)   Resp 18   SpO2 97%    Visual Acuity Right Eye Distance:   Left Eye Distance:   Bilateral Distance:    Right Eye Near:   Left Eye Near:    Bilateral Near:     Physical Exam Gen: NAD, alert, cooperative with exam,  ENT: normal lips, normal nasal mucosa, tympanic membranes clear and intact bilaterally, normal oropharynx, no cervical lymphadenopathy Eye: normal EOM, normal conjunctiva and lids CV:  no edema, +2 pedal pulses, regular rhythm, S1-S2   Resp: no accessory muscle use, non-labored, clear to auscultation bilaterally, no crackles or wheezes  Skin: no rashes, no areas of induration  Neuro: normal tone, normal sensation to touch Psych:  normal insight, alert and oriented MSK: Normal gait, normal strength    UC Treatments / Results  Labs (all labs ordered are listed, but only abnormal results are displayed) Labs Reviewed - No data to display  EKG None  Radiology No results found.  Procedures Procedures (including critical care time)  Medications Ordered in UC Medications - No data to display  Initial Impression / Assessment and Plan / UC Course  I have reviewed the triage vital signs and the nursing notes.  Pertinent labs & imaging results that were available during my care of the patient were reviewed by me and considered in my medical decision making (see chart for details).     Lori Booth is a 58 year old female is presenting with symptoms suggestive of flulike illness.  Has no recorded fever.  Provided tamiflu. Counseled on supportive care. Given indications to return. Provided a work note.   Final Clinical Impressions(s) / UC Diagnoses   Final diagnoses:  Flu-like symptoms     Discharge Instructions     Please use tylenol or ibuprofen for fever  Please stay well hydrated  Please try honey, vick's vapor rub, lozenges and humidifer for cough and sore throat  Please follow up if your symptoms fail to improve.     ED Prescriptions    Medication Sig Dispense Auth. Provider    oseltamivir (TAMIFLU) 75 MG capsule  Take 1 capsule (75 mg total) by mouth 2 (two) times daily. 10 capsule Myra Rude, MD     Controlled Substance Prescriptions [place] Controlled Substance Registry consulted? Not Applicable   Myra Rude, MD 05/14/18 2032

## 2018-05-18 ENCOUNTER — Telehealth: Payer: Self-pay

## 2018-05-18 ENCOUNTER — Ambulatory Visit: Payer: BLUE CROSS/BLUE SHIELD | Admitting: Family

## 2018-05-18 NOTE — Telephone Encounter (Signed)
Left message on voicemail for patient to call back, as i'm entering this note, patient has now called back---she has some coughing, some yellow mucus with sputum coughed up, slight fever at times, really bad headache---patient is taking tamiflu---but has not felt any better yet, was given tamiflu at 3/12 office visit with dr schmitz/urgent care----per laura/NP, I am advising patient that she needs to give this a little more time to start feeling better, she is already on tx for possible flu, and flu can take 7-10 days to resolve---she can go for further testing if needed starting tomorrow with [facility], patient advised to call us back if she decides to use tent for testing, we need to place order for her to go ahead of time---she can take advil/tylenol for headache management--I am cancelling appt with laura today--patient understands to call back for order if she decides to do tent testing-

## 2019-08-31 ENCOUNTER — Other Ambulatory Visit: Payer: Self-pay

## 2019-08-31 ENCOUNTER — Other Ambulatory Visit (HOSPITAL_COMMUNITY)
Admission: RE | Admit: 2019-08-31 | Discharge: 2019-08-31 | Disposition: A | Discharge status: Home / Self Care | Payer: BC Managed Care – PPO | Source: Ambulatory Visit | Attending: Obstetrics | Admitting: Obstetrics

## 2019-08-31 ENCOUNTER — Ambulatory Visit (INDEPENDENT_AMBULATORY_CARE_PROVIDER_SITE_OTHER): Payer: BC Managed Care – PPO | Admitting: Obstetrics

## 2019-08-31 ENCOUNTER — Encounter: Payer: Self-pay | Admitting: Obstetrics

## 2019-08-31 VITALS — BP 122/83 | HR 69 | Ht 63.0 in | Wt 170.2 lb

## 2019-08-31 DIAGNOSIS — N952 Postmenopausal atrophic vaginitis: Secondary | ICD-10-CM | POA: Diagnosis not present

## 2019-08-31 DIAGNOSIS — Z01419 Encounter for gynecological examination (general) (routine) without abnormal findings: Secondary | ICD-10-CM | POA: Diagnosis present

## 2019-08-31 DIAGNOSIS — N951 Menopausal and female climacteric states: Secondary | ICD-10-CM

## 2019-08-31 LAB — HM PAP SMEAR

## 2019-08-31 MED ORDER — FEMRING 0.1 MG/24HR VA RING: 1.0000 | VAGINAL_RING | VAGINAL | 12 refills | Status: DC

## 2019-08-31 NOTE — Progress Notes (Signed)
Subjective:        Lori Booth is a 59 y.o. female here for a routine exam.  Current complaints: Vaginal dryness and hot flushes.    Personal health questionnaire:  Is patient [name], have a family history of breast and/or ovarian cancer: no Is there a family history of uterine cancer diagnosed at age < 8, gastrointestinal cancer, urinary tract cancer, family member who is a Personnel officer syndrome-associated carrier: no Is the patient overweight and hypertensive, family history of diabetes, personal history of gestational diabetes, preeclampsia or PCOS: no Is patient over 70, have PCOS,  family history of premature CHD under age 29, diabetes, smoke, have hypertension or peripheral artery disease:  no At any time, has a partner hit, kicked or otherwise hurt or frightened you?: no Over the past 2 weeks, have you felt down, depressed or hopeless?: no Over the past 2 weeks, have you felt little interest or pleasure in doing things?:no   Gynecologic History No LMP recorded. Patient is postmenopausal. Contraception: post menopausal status Last Pap: 2019. Results were: normal Last mammogram: 2019. Results were: normal  Obstetric History OB History  Gravida Para Term Preterm AB Living  1 1 1     1   SAB TAB Ectopic Multiple Live Births          1    # Outcome Date GA Lbr Len/2nd Weight Sex Delivery Anes PTL Lv  1 Term 09/10/82 [redacted]w[redacted]d   M CS-LTranv  N LIV    Past Medical History:  Diagnosis Date  . GERD (gastroesophageal reflux disease)   . History of frequent urinary tract infections    Per pt, over the past 15 months, has had 7 UTI's    Past Surgical History:  Procedure Laterality Date  . BARIATRIC SURGERY    . CESAREAN SECTION    . cyrocautery    . TUBAL LIGATION       Current Outpatient Medications:  .  cephALEXin (KEFLEX) 500 MG capsule, Take 1 capsule (500 mg total) by mouth 3 (three) times daily. (Patient not taking: Reported on 08/31/2019), Disp: 30 capsule, Rfl:  0 .  Cholecalciferol 2000 units TABS, Take 1 tablet (2,000 Units total) by mouth daily. With a meal or glass of milk. (Patient not taking: Reported on 08/31/2019), Disp: 90 tablet, Rfl: 3 .  Estradiol Acetate (FEMRING) 0.1 MG/24HR RING, Place 1 each vaginally every 3 (three) months., Disp: 1 each, Rfl: 12 .  oseltamivir (TAMIFLU) 75 MG capsule, Take 1 capsule (75 mg total) by mouth 2 (two) times daily. (Patient not taking: Reported on 08/31/2019), Disp: 10 capsule, Rfl: 0 .  Ospemifene (OSPHENA) 60 MG TABS, Take 1 tablet by mouth daily. (Patient not taking: Reported on 08/31/2019), Disp: 30 tablet, Rfl: 12 .  pantoprazole (PROTONIX) 40 MG tablet, Take 40 mg by mouth daily. (Patient not taking: Reported on 08/31/2019), Disp: , Rfl:  Allergies  Allergen Reactions  . Nsaids Other (See Comments)    INFLAMES ESOPHAGUS   . Naproxen Other (See Comments)    Severe GERD    Social History   Tobacco Use  . Smoking status: Never Smoker  . Smokeless tobacco: Never Used  Substance Use Topics  . Alcohol use: No    Alcohol/week: 0.0 standard drinks    Comment: social    Family History  Problem Relation Age of Onset  . Stroke Father   . Heart disease Father   . Hyperlipidemia Father   . Arthritis Mother   . Diabetes  Sister   . Cancer Neg Hx   . Alcohol abuse Neg Hx   . Drug abuse Neg Hx   . Early death Neg Hx   . Hearing loss Neg Hx   . Hypertension Neg Hx   . Kidney disease Neg Hx       Review of Systems  Constitutional: negative for fatigue and weight loss Respiratory: negative for cough and wheezing Cardiovascular: negative for chest pain, fatigue and palpitations Gastrointestinal: negative for abdominal pain and change in bowel habits Musculoskeletal:negative for myalgias Neurological: negative for gait problems and tremors Behavioral/Psych: negative for abusive relationship, depression Endocrine: positive for hot flushes Genitourinary:positive for vaginal  dryness Integument/breast: negative for breast lump, breast tenderness, nipple discharge and skin lesion(s)    Objective:       BP 122/83   Pulse 69   Ht 5\' 3"  (1.6 m)   Wt 170 lb 3.2 oz (77.2 kg)   BMI 30.15 kg/m  General:   alert and no distress  Skin:   no rash or abnormalities  Lungs:   clear to auscultation bilaterally  Heart:   regular rate and rhythm, S1, S2 normal, no murmur, click, rub or gallop  Breasts:   normal without suspicious masses, skin or nipple changes or axillary nodes  Abdomen:  normal findings: no organomegaly, soft, non-tender and no hernia  Pelvis:  External genitalia: normal general appearance Urinary system: urethral meatus normal and bladder without fullness, nontender Vaginal: normal without tenderness, induration or masses.  Atrophic mucosal changes Cervix: normal appearance Adnexa: normal bimanual exam Uterus: anteverted and non-tender, normal size   Lab Review Urine pregnancy test Labs reviewed yes Radiologic studies reviewed yes  50% of 20 min visit spent on counseling and coordination of care.   Assessment:     1. Encounter for routine gynecological examination with Papanicolaou smear of cervix Rx: - Cytology - PAP( [facility])  2. Postmenopausal atrophic vaginitis Rx: - Estradiol Acetate (FEMRING) 0.1 MG/24HR RING; Place 1 each vaginally every 3 (three) months.  Dispense: 1 each; Refill: 12  3. Menopausal hot flushes - [facility] Rx    Plan:    Education reviewed: calcium supplements, depression evaluation, low fat, low cholesterol diet, safe sex/STD prevention, self breast exams and weight bearing exercise. Follow up in: 1 year.   Meds ordered this encounter  Medications  . Estradiol Acetate (FEMRING) 0.1 MG/24HR RING    Sig: Place 1 each vaginally every 3 (three) months.    Dispense:  1 each    Refill:  12     [facility], MD 08/31/2019 9:36 AM

## 2019-08-31 NOTE — Progress Notes (Signed)
Pt is here for annual gyn exam. Last pap 08/07/17, normal. Last MMG 09/02/17, normal.

## 2019-09-01 LAB — CYTOLOGY - PAP
Comment: NEGATIVE
Diagnosis: NEGATIVE
High risk HPV: NEGATIVE

## 2020-01-05 ENCOUNTER — Ambulatory Visit (INDEPENDENT_AMBULATORY_CARE_PROVIDER_SITE_OTHER): Payer: BC Managed Care – PPO

## 2020-01-05 ENCOUNTER — Ambulatory Visit: Payer: BC Managed Care – PPO | Admitting: Family Medicine

## 2020-01-05 ENCOUNTER — Other Ambulatory Visit: Payer: Self-pay

## 2020-01-05 VITALS — BP 118/72 | HR 77 | Ht 63.0 in | Wt 168.0 lb

## 2020-01-05 DIAGNOSIS — M791 Myalgia, unspecified site: Secondary | ICD-10-CM | POA: Diagnosis not present

## 2020-01-05 DIAGNOSIS — M25559 Pain in unspecified hip: Secondary | ICD-10-CM

## 2020-01-05 DIAGNOSIS — K219 Gastro-esophageal reflux disease without esophagitis: Secondary | ICD-10-CM

## 2020-01-05 LAB — CBC WITH DIFFERENTIAL/PLATELET
Basophils Absolute: 0 10*3/uL (ref 0.0–0.1)
Basophils Relative: 0.5 % (ref 0.0–3.0)
Eosinophils Absolute: 0.2 10*3/uL (ref 0.0–0.7)
Eosinophils Relative: 3.2 % (ref 0.0–5.0)
HCT: 38.5 % (ref 36.0–46.0)
Hemoglobin: 12.8 g/dL (ref 12.0–15.0)
Lymphocytes Relative: 25.2 % (ref 12.0–46.0)
Lymphs Abs: 1.6 10*3/uL (ref 0.7–4.0)
MCHC: 33.2 g/dL (ref 30.0–36.0)
MCV: 86.3 fl (ref 78.0–100.0)
Monocytes Absolute: 0.5 10*3/uL (ref 0.1–1.0)
Monocytes Relative: 8.1 % (ref 3.0–12.0)
Neutro Abs: 3.9 10*3/uL (ref 1.4–7.7)
Neutrophils Relative %: 63 % (ref 43.0–77.0)
Platelets: 256 10*3/uL (ref 150.0–400.0)
RBC: 4.46 Mil/uL (ref 3.87–5.11)
RDW: 13.4 % (ref 11.5–15.5)
WBC: 6.3 10*3/uL (ref 4.0–10.5)

## 2020-01-05 LAB — COMPREHENSIVE METABOLIC PANEL
ALT: 15 U/L (ref 0–35)
AST: 16 U/L (ref 0–37)
Albumin: 4 g/dL (ref 3.5–5.2)
Alkaline Phosphatase: 54 U/L (ref 39–117)
BUN: 19 mg/dL (ref 6–23)
CO2: 32 mEq/L (ref 19–32)
Calcium: 9.5 mg/dL (ref 8.4–10.5)
Chloride: 102 mEq/L (ref 96–112)
Creatinine, Ser: 0.62 mg/dL (ref 0.40–1.20)
GFR: 98.58 mL/min (ref >60.00)
Glucose, Bld: 79 mg/dL (ref 70–99)
Potassium: 3.7 mEq/L (ref 3.5–5.1)
Sodium: 139 mEq/L (ref 135–145)
Total Bilirubin: 0.4 mg/dL (ref 0.2–1.2)
Total Protein: 7.4 g/dL (ref 6.0–8.3)

## 2020-01-05 LAB — TSH: TSH: 1.09 u[IU]/mL (ref 0.35–4.50)

## 2020-01-05 LAB — SEDIMENTATION RATE: Sed Rate: 24 mm/hr (ref 0–30)

## 2020-01-05 LAB — CK: Total CK: 184 U/L — ABNORMAL HIGH (ref 7–177)

## 2020-01-05 NOTE — Progress Notes (Signed)
Subjective:    CC: leg pain  I, Molly Weber, LAT, ATC, am serving as scribe for Dr. Lynne Leader.  HPI: Pt is a 59 y/o female presenting w/ c/o bilat leg pain along the anterior thigh. No know MOI. Pt stated pn started 1-2 wks ago. No LB pn or LE weakness noted. Aggravating factors: standing. Treatments tried: tylenol, c no relief.  Pain located some in the groin and some to the anterior thigh.  Worse with prolonged standing.  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   Objective:    Vitals:   01/05/20 0858  BP: 118/72  Pulse: 77  SpO2: 98%   General: Well Developed, well nourished, and in no acute distress.   MSK: L-spine normal-appearing nontender normal lumbar motion. Lower extremity strength is intact except noted below.  Reflexes equal and intact throughout bilateral extremities. Hips bilaterally normal-appearing Nontender. Pain and limited motion with hip flexion and internal rotation bilaterally. Hip abduction strength diminished 4/5 bilaterally. Normal gait. Thighs nontender bilaterally.  Lab and Radiology Results  X-ray images L-spine and AP pelvis obtained today personally and independently interpreted.  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  DJD L5-S1 facet joint  AP pelvis: No severe hip DJD.  No fractures  Await formal radiology review   Impression and Recommendations:    Assessment and Plan: 59 y.o. female with bilateral thigh pain ongoing for a week or 2 with prolonged standing.  Concerning for hip DJD L2 or 3 radiculopathy or even in general myopathy/myalgia.  Plan for x-rays as above as well as limited lab/rheumatologic work-up listed below.  Assuming hip etiology main cause of pain will proceed with physical therapy.  Would consider prednisone and gabapentin for radicular cause.  If labs significantly abnormal we will proceed with further refined  work-up.  PDMP not reviewed this encounter. Orders Placed This Encounter  Procedures  . DG Pelvis 1-2 Views    Standing Status:   Future    Standing Expiration Date:   01/04/2021    Order Specific Question:   Reason for Exam (SYMPTOM  OR DIAGNOSIS REQUIRED)    Answer:   eval hip pain BL    Order Specific Question:   Is patient pregnant?    Answer:   No    Order Specific Question:   Preferred imaging location?    Answer:   [facility]  . DG Lumbar Spine 2-3 Views    Standing Status:   Future    Standing Expiration Date:   01/04/2021    Order Specific Question:   Reason for Exam (SYMPTOM  OR DIAGNOSIS REQUIRED)    Answer:   eval poss L2 or L3 rad BL    Order Specific Question:   Is patient pregnant?    Answer:   No    Order Specific Question:   Preferred imaging location?    Answer:   [facility]  . CBC with Differential/Platelet    Standing Status:   Future    Standing Expiration Date:   01/04/2021  . TSH    Standing Status:   Future    Standing Expiration Date:   01/04/2021  . CK    Standing Status:   Future    Standing Expiration Date:   01/04/2021  . Sedimentation rate    Standing Status:   Future    Standing Expiration Date:   01/04/2021  . Comp  Met (CMET)    Standing Status:   Future    Standing Expiration Date:   01/04/2021   No orders of the defined types were placed in this encounter.   Discussed warning signs or symptoms. Please see discharge instructions. Patient expresses understanding.   The above documentation has been reviewed and is accurate and complete Lynne Leader, M.D.

## 2020-01-05 NOTE — Patient Instructions (Addendum)
Thank you for coming in today.  Plan for xray and labs today.  Response will change based xray and labs.  Anticipate medicine and physical therapy.  We will contact you.  I will avoid ibuprofen like medicines.   Recheck in 6 weeks.  Keep me updated.  You should hear from my office this week.

## 2020-01-06 NOTE — Progress Notes (Signed)
X-ray shows some moderate dryness in the low back. Physical therapy already ordered. This should help quite a bit.

## 2020-01-06 NOTE — Progress Notes (Signed)
Liver and kidney labs look normal.CK lab for muscle inflammation is minimally elevated.  Unclear if this is the source of pain or not.Thyroid and other inflammatory labs look normal. Plan to proceed with physical therapy.  Order placed.  Awaiting results of x-rays.

## 2020-01-06 NOTE — Progress Notes (Signed)
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

## 2020-01-06 NOTE — Addendum Note (Signed)
Addended by: Rodolph Bong on: 01/06/2020 07:28 AM   Modules accepted: Orders

## 2020-01-11 ENCOUNTER — Telehealth: Payer: Self-pay | Admitting: Family Medicine

## 2020-01-11 NOTE — Telephone Encounter (Signed)
Pt seen 11/3 for leg pain, requesting anti inflammatory (  Cannot take ibuprofen family of meds per pt ).  [facility]

## 2020-01-12 MED ORDER — PREDNISONE 5 MG (48) PO TBPK: ORAL_TABLET | ORAL | 0 refills | Status: DC

## 2020-01-12 NOTE — Telephone Encounter (Signed)
You were significantly intolerant to ibuprofen like medications which includes most topical anti-inflammatories that I could prescribe.  However I cannot prescribe prednisone which is a potent anti-inflammatory.  Be sure to take your antacid medication Protonix with the prednisone.  I sent in a 12-day [facility] [address] [facility] the [address].

## 2020-01-12 NOTE — Telephone Encounter (Signed)
Called pt and relayed Dr. Corey's message.  Pt verbalizes understanding. 

## 2020-01-19 ENCOUNTER — Telehealth: Payer: Self-pay | Admitting: Internal Medicine

## 2020-01-19 NOTE — Telephone Encounter (Signed)
I meant to send this as a high priority

## 2020-01-19 NOTE — Telephone Encounter (Signed)
Patient called and said that predniSONE (STERAPRED UNI-PAK 48 TAB) 5 MG (48) TBPK tablet  She said that it is not working and she thinks she may be allergic. She said it makes her feel awful.   She can be reached at [phone]

## 2020-01-21 NOTE — Telephone Encounter (Signed)
Left detailed message on pt's cell phone and advised to stop prednisone and call us back to gather additional information.

## 2020-01-21 NOTE — Telephone Encounter (Signed)
Please contact pt to get more info.   STOP the prednisone. It is likely side effects that she is experiencing not allergy.  We typically will use prednisone to treat allergic reactions.  Is very rare to be allergic to prednisone but side effects are pretty common.  Recommend stopping the prednisone and scheduling a follow-up appointment with me next week.  Please gather more information.

## 2020-02-18 NOTE — Progress Notes (Signed)
NO show

## 2020-02-21 ENCOUNTER — Ambulatory Visit: Payer: BC Managed Care – PPO | Admitting: Family Medicine

## 2020-03-16 ENCOUNTER — Ambulatory Visit (INDEPENDENT_AMBULATORY_CARE_PROVIDER_SITE_OTHER): Payer: BC Managed Care – PPO | Admitting: Podiatry

## 2020-03-16 ENCOUNTER — Other Ambulatory Visit: Payer: Self-pay

## 2020-03-16 ENCOUNTER — Encounter: Payer: Self-pay | Admitting: Podiatry

## 2020-03-16 ENCOUNTER — Ambulatory Visit: Payer: Self-pay

## 2020-03-16 DIAGNOSIS — S90121A Contusion of right lesser toe(s) without damage to nail, initial encounter: Secondary | ICD-10-CM

## 2020-03-16 DIAGNOSIS — M205X1 Other deformities of toe(s) (acquired), right foot: Secondary | ICD-10-CM

## 2020-03-16 DIAGNOSIS — M79671 Pain in right foot: Secondary | ICD-10-CM

## 2020-03-16 DIAGNOSIS — Q828 Other specified congenital malformations of skin: Secondary | ICD-10-CM

## 2020-03-16 NOTE — Progress Notes (Signed)
Subjective: 60 year old female presents the office today for concerns of pain to her right fifth toe.  This been ongoing for last 2 weeks and she denies recent injury or trauma.  She denies any change in activity levels or shoe.  She does state that she wears steel toed shoes all day.  She is asking for a note to wear shoe with a toe cap.  No recent injury.  Denies any systemic complaints such as fevers, chills, nausea, vomiting. No acute changes since last appointment, and no other complaints at this time.   Objective: AAO x3, NAD DP/PT pulses palpable bilaterally, CRT less than 3 seconds Adductovarus present of right fifth toe resulting in a hyperkeratotic lesion on the dorsal lateral aspect of the fifth digit.  Upon debridement there is no underlying ulceration drainage or any signs of infection.  Tenderness palpation directly along the hyperkeratotic lesion prior to debridement.  There is minimal hyperkeratotic tissue on the medial hallux IPJ with no pain.  There is no open lesions. 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  -She will be debrided the lesion today without any complications or bleeding.  Recommended moisturizer daily as well as offloading pads which were dispensed today.  Note was provided to wear a shoe with a toe cap and over this will be helpful for her as well.  Discussed with her that long-term consider arthroplasty at the fifth digit if needed. -Patient encouraged to call the office with any questions, concerns, change in symptoms.   Vivi Barrack DPM

## 2020-03-25 ENCOUNTER — Other Ambulatory Visit: Payer: Self-pay

## 2020-03-25 ENCOUNTER — Encounter (HOSPITAL_COMMUNITY): Payer: Self-pay

## 2020-03-25 ENCOUNTER — Ambulatory Visit (HOSPITAL_COMMUNITY)
Admission: EM | Admit: 2020-03-25 | Discharge: 2020-03-25 | Disposition: A | Discharge status: Home / Self Care | Payer: BC Managed Care – PPO | Attending: Internal Medicine | Admitting: Internal Medicine

## 2020-03-25 DIAGNOSIS — Z1152 Encounter for screening for COVID-19: Secondary | ICD-10-CM

## 2020-03-25 DIAGNOSIS — B349 Viral infection, unspecified: Secondary | ICD-10-CM | POA: Insufficient documentation

## 2020-03-25 LAB — SARS CORONAVIRUS 2 (TAT 6-24 HRS): SARS Coronavirus 2: NEGATIVE

## 2020-03-25 NOTE — ED Provider Notes (Signed)
[facility]    CSN: [id number] Arrival date & time: 03/25/20  1201      History   Chief Complaint Chief Complaint  Patient presents with  . Covid Exposure    Sore throat, runny nose and headache symptom started today.    HPI Lori Booth is a 60 y.o. female comes to the urgent care after being exposed to a COVID-19 positive individual at work.  Patient was exposed a few days ago.  She started having symptoms today.  She is complaining of a runny nose, headaches, generalized body aches which started this morning.  No nausea, vomiting or diarrhea.  No loss of taste or smell.   Patient has received the primary vaccination series for COVID-19  HPI  Past Medical History:  Diagnosis Date  . GERD (gastroesophageal reflux disease)   . History of frequent urinary tract infections    Per pt, over the past 15 months, has had 7 UTI's    Patient Active Problem List   Diagnosis Date Noted  . Ingrown toenail 11/20/2017  . Vitamin D deficiency 06/28/2015  . 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 Acute pain of left knee 04/22/2014  . GERD (gastroesophageal reflux disease) 08/13/2013  . Routine general medical examination at a health care facility 06/03/2013  . Visit for screening mammogram 06/03/2013  . Hyperglycemia 06/02/2012  . Hyperlipidemia LDL goal < 130 06/02/2012  . Abnormal renal finding 06/02/2012    Past Surgical History:  Procedure Laterality Date  . BARIATRIC SURGERY    . CESAREAN SECTION    . cyrocautery    . TUBAL LIGATION      OB History    Gravida  1   Para  1   Term  1   Preterm      AB      Living  1     SAB      IAB      Ectopic      Multiple      Live Births  1            Home Medications    Prior to Admission medications   Medication Sig Start Date End Date Taking? Authorizing Provider  pantoprazole (PROTONIX) 40 MG tablet Take 1 tablet by mouth daily. 04/13/19   [provider]  predniSONE  (STERAPRED UNI-PAK 48 TAB) 5 MG (48) TBPK tablet 12 day dosepack po 01/12/20   Rodolph Bong, MD    Family History Family History  Problem Relation Age of Onset  . Stroke Father   . Heart disease Father   . Hyperlipidemia Father   . Arthritis Mother   . Diabetes Sister   . Cancer Neg Hx   . Alcohol abuse Neg Hx   . Drug abuse Neg Hx   . Early death Neg Hx   . Hearing loss Neg Hx   . Hypertension Neg Hx   . Kidney disease Neg Hx     Social History Social History   Tobacco Use  . Smoking status: Never Smoker  . Smokeless tobacco: Never Used  Substance Use Topics  . Alcohol use: No    Alcohol/week: 0.0 standard drinks    Comment: social  . Drug use: No     Allergies   Ibuprofen, Nsaids, and Naproxen   Review of Systems Review of Systems  Constitutional: Negative.   HENT: Positive for rhinorrhea and sore throat.   Respiratory: Positive for cough. Negative for shortness of breath and  wheezing.   Cardiovascular: Negative.   Musculoskeletal: Positive for arthralgias and myalgias.     Physical Exam Triage Vital Signs ED Triage Vitals [03/25/20 1221]  Enc Vitals Group     BP 117/78     Pulse Rate 81     Resp 16     Temp 97.8 F (36.6 C)     Temp Source Oral     SpO2 99 %     Weight      Height      Head Circumference      Peak Flow      Pain Score 0     Pain Loc      Pain Edu?      Excl. in GC?    No data found.  Updated Vital Signs BP 117/78 (BP Location: Right Arm)   Pulse 81   Temp 97.8 F (36.6 C) (Oral)   Resp 16   SpO2 99%   Visual Acuity Right Eye Distance:   Left Eye Distance:   Bilateral Distance:    Right Eye Near:   Left Eye Near:    Bilateral Near:     Physical Exam Vitals and nursing note reviewed.  Constitutional:      General: She is not in acute distress.    Appearance: She is not ill-appearing.  HENT:     Mouth/Throat:     Mouth: Mucous membranes are moist.     Pharynx: No oropharyngeal exudate or posterior  oropharyngeal erythema.  Cardiovascular:     Rate and Rhythm: Normal rate and regular rhythm.  Pulmonary:     Effort: Pulmonary effort is normal.     Breath sounds: Normal breath sounds.  Abdominal:     General: Abdomen is flat. Bowel sounds are normal.  Neurological:     Mental Status: She is alert.      UC Treatments / Results  Labs (all labs ordered are listed, but only abnormal results are displayed) Labs Reviewed  SARS CORONAVIRUS 2 (TAT 6-24 HRS)    EKG   Radiology No results found.  Procedures Procedures (including critical care time)  Medications Ordered in UC Medications - No data to display  Initial Impression / Assessment and Plan / UC Course  I have reviewed the triage vital signs and the nursing notes.  Pertinent labs & imaging results that were available during my care of the patient were reviewed by me and considered in my medical decision making (see chart for details).     1.  Acute viral illness in the setting of COVID-19 exposure: COVID-19 PCR test sent Tylenol as needed for body aches/headache/fever Please quarantine until COVID-19 test results are available Increase oral fluid intake Return to urgent care if symptoms worsen. Final Clinical Impressions(s) / UC Diagnoses   Final diagnoses:  Encounter for screening for COVID-19  Viral illness     Discharge Instructions     Increase oral fluid intake Take medications as prescribed If symptoms worsen please call us back We will call you with recommendations if labs are abnormal.   ED Prescriptions    None     PDMP not reviewed this encounter.   Merrilee Jansky, MD 03/25/20 1254

## 2020-03-25 NOTE — Discharge Instructions (Addendum)
Increase oral fluid intake Take medications as prescribed If symptoms worsen please call us back We will call you with recommendations if labs are abnormal.

## 2020-03-25 NOTE — ED Triage Notes (Signed)
Pt present covid exposure from a coworker at work. Pt states symptoms started today with sore throat, runny nose, headaches and body aches.

## 2020-04-12 LAB — CBC AND DIFFERENTIAL
HCT: 36 (ref 36–46)
Hemoglobin: 12.2 (ref 12.0–16.0)
Platelets: 277 (ref 150–399)
WBC: 5.9

## 2020-04-12 LAB — LIPID PANEL
Cholesterol: 228 — AB (ref 0–200)
HDL: 91 — AB (ref 35–70)
LDL Cholesterol: 111
Triglycerides: 131 (ref 40–160)

## 2020-04-12 LAB — TSH: TSH: 0.83 (ref 0.41–5.90)

## 2020-05-23 ENCOUNTER — Other Ambulatory Visit: Payer: Self-pay

## 2020-05-23 ENCOUNTER — Ambulatory Visit (HOSPITAL_COMMUNITY)
Admission: EM | Admit: 2020-05-23 | Discharge: 2020-05-23 | Disposition: A | Discharge status: Home / Self Care | Payer: BC Managed Care – PPO | Attending: Student | Admitting: Student

## 2020-05-23 ENCOUNTER — Encounter (HOSPITAL_COMMUNITY): Payer: Self-pay | Admitting: Emergency Medicine

## 2020-05-23 DIAGNOSIS — J069 Acute upper respiratory infection, unspecified: Secondary | ICD-10-CM

## 2020-05-23 DIAGNOSIS — H8113 Benign paroxysmal vertigo, bilateral: Secondary | ICD-10-CM

## 2020-05-23 DIAGNOSIS — J301 Allergic rhinitis due to pollen: Secondary | ICD-10-CM

## 2020-05-23 LAB — CBG MONITORING, ED: Glucose-Capillary: 99 mg/dL (ref 70–99)

## 2020-05-23 MED ORDER — MECLIZINE HCL 12.5 MG PO TABS: 12.5000 mg | ORAL_TABLET | Freq: Three times a day (TID) | ORAL | 0 refills | Status: DC | PRN

## 2020-05-23 NOTE — ED Triage Notes (Signed)
Pt presents with dizziness, headache, congestion, and cough xs 6 days. States dizziness started after being seen on 3/17  States was seen at [facility] on 3/17 and dxed with a sinus infection. Was tested for COVID with negative test result.

## 2020-05-23 NOTE — Discharge Instructions (Signed)
-  For your dizziness, start the meclizine (Antivert), 1 pill up to 3 times daily for dizziness.  This can cause drowsiness, so take when you do not have to drive or operate machinery. -Continue your antihistamines for allergic rhinitis component. -I provided a work note for 3 days.  If you need medical clearance to return to work, you will have to present back here or a different facility for this. -If you still have symptoms in about 1 week, contact an ear nose and throat doctor for further evaluation management. -If you have chest pain, shortness of breath, worse headache of life, vision changes-had straight to the ER or call 911.

## 2020-05-23 NOTE — ED Provider Notes (Signed)
[facility]    CSN: [id number] Arrival date & time: 05/23/20  1038      History   Chief Complaint Chief Complaint  Patient presents with  . Dizziness  . Cough  . Nasal Congestion    HPI Lori Booth is a 60 y.o. female presenting for dizziness following URI. Also with allergic rhintis. History hyperlipidemia, hyperglycemia, GERD.  States she developed cold symptoms on 3/17.  Was seen at [facility] and was prescribed Xyzal and Sudafed for this; states her symptoms have improved, including the nasal congestion; however for the last 5 days she has had dizziness.  Describes this as disequilibrium, constant but worse with standing and walking.  Denies ear pain, hearing changes, tinnitus, worse headache of life, vision changes, chest pain, shortness of breath, loss of consciousness, head trauma.  HPI  Past Medical History:  Diagnosis Date  . GERD (gastroesophageal reflux disease)   . History of frequent urinary tract infections    Per pt, over the past 15 months, has had 7 UTI's    Patient Active Problem List   Diagnosis Date Noted  . Ingrown toenail 11/20/2017  . Vitamin D deficiency 06/28/2015  . 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 Acute pain of left knee 04/22/2014  . GERD (gastroesophageal reflux disease) 08/13/2013  . Routine general medical examination at a health care facility 06/03/2013  . Visit for screening mammogram 06/03/2013  . Hyperglycemia 06/02/2012  . Hyperlipidemia LDL goal < 130 06/02/2012  . Abnormal renal finding 06/02/2012    Past Surgical History:  Procedure Laterality Date  . BARIATRIC SURGERY    . CESAREAN SECTION    . cyrocautery    . TUBAL LIGATION      OB History    Gravida  1   Para  1   Term  1   Preterm      AB      Living  1     SAB      IAB      Ectopic      Multiple      Live Births  1            Home Medications    Prior to Admission medications   Medication Sig Start Date  End Date Taking? Authorizing Provider  meclizine (ANTIVERT) 12.5 MG tablet Take 1 tablet (12.5 mg total) by mouth 3 (three) times daily as needed for dizziness. 05/23/20  Yes Rhys Martini, PA-C  [facility] 12 HOUR NASAL DECONGESTANT 120 MG 12 hr tablet SMARTSIG:1 Tablet(s) By Mouth Every 12 Hours 05/18/20   [provider]  fluticasone (FLONASE) 50 MCG/ACT nasal spray Place into both nostrils. 05/18/20   [provider]  levocetirizine (XYZAL) 5 MG tablet SMARTSIG:1 Tablet(s) By Mouth Every Evening 05/18/20   [provider]  pantoprazole (PROTONIX) 40 MG tablet Take 1 tablet by mouth daily. 04/13/19   [provider]  predniSONE (STERAPRED UNI-PAK 48 TAB) 5 MG (48) TBPK tablet 12 day dosepack po 01/12/20   Rodolph Bong, MD    Family History Family History  Problem Relation Age of Onset  . Stroke Father   . Heart disease Father   . Hyperlipidemia Father   . Arthritis Mother   . Diabetes Sister   . Cancer Neg Hx   . Alcohol abuse Neg Hx   . Drug abuse Neg Hx   . Early death Neg Hx   . Hearing loss Neg Hx   .  Hypertension Neg Hx   . Kidney disease Neg Hx     Social History Social History   Tobacco Use  . Smoking status: Never Smoker  . Smokeless tobacco: Never Used  Substance Use Topics  . Alcohol use: No    Alcohol/week: 0.0 standard drinks    Comment: social  . Drug use: No     Allergies   Ibuprofen, Nsaids, and Naproxen   Review of Systems Review of Systems  Constitutional: Negative for appetite change, chills and fever.  HENT: Positive for congestion. Negative for ear pain, rhinorrhea, sinus pressure, sinus pain, sore throat and tinnitus.   Eyes: Negative for redness and visual disturbance.  Respiratory: Negative for cough, chest tightness, shortness of breath and wheezing.   Cardiovascular: Negative for chest pain and palpitations.  Gastrointestinal: Negative for abdominal pain, constipation, diarrhea, nausea and vomiting.   Genitourinary: Negative for dysuria, frequency and urgency.  Musculoskeletal: Negative for myalgias.  Neurological: Positive for dizziness. Negative for tremors, seizures, syncope, facial asymmetry, speech difficulty, weakness, light-headedness, numbness and headaches.  Psychiatric/Behavioral: Negative for confusion.  All other systems reviewed and are negative.    Physical Exam Triage Vital Signs ED Triage Vitals  Enc Vitals Group     BP 05/23/20 1118 109/76     Pulse Rate 05/23/20 1118 81     Resp 05/23/20 1118 17     Temp 05/23/20 1118 97.8 F (36.6 C)     Temp Source 05/23/20 1118 Oral     SpO2 05/23/20 1118 100 %     Weight --      Height --      Head Circumference --      Peak Flow --      Pain Score 05/23/20 1116 3     Pain Loc --      Pain Edu? --      Excl. in GC? --    No data found.  Updated Vital Signs BP 109/76 (BP Location: Right Arm)   Pulse 81   Temp 97.8 F (36.6 C) (Oral)   Resp 17   SpO2 100%   Visual Acuity Right Eye Distance:   Left Eye Distance:   Bilateral Distance:    Right Eye Near:   Left Eye Near:    Bilateral Near:     Physical Exam Vitals reviewed.  Constitutional:      Appearance: Normal appearance.  HENT:     Head: Normocephalic and atraumatic.     Right Ear: Hearing, tympanic membrane, ear canal and external ear normal. No swelling or tenderness. No middle ear effusion. No mastoid tenderness. Tympanic membrane is not erythematous, retracted or bulging.     Left Ear: Hearing, tympanic membrane, ear canal and external ear normal. No swelling or tenderness.  No middle ear effusion. No mastoid tenderness. Tympanic membrane is not erythematous, retracted or bulging.     Nose: No congestion.     Mouth/Throat:     Mouth: Mucous membranes are moist.     Pharynx: No posterior oropharyngeal erythema.  Eyes:     Extraocular Movements: Extraocular movements intact.     Pupils: Pupils are equal, round, and reactive to light.   Cardiovascular:     Rate and Rhythm: Normal rate and regular rhythm.     Heart sounds: Normal heart sounds.  Pulmonary:     Effort: Pulmonary effort is normal.     Breath sounds: Normal breath sounds.  Abdominal:     Palpations: Abdomen is soft.  Tenderness: There is no abdominal tenderness. There is no guarding or rebound.  Skin:    Capillary Refill: Capillary refill takes less than 2 seconds.     Comments: Good skin turgor, appears well hydrated.  Neurological:     General: No focal deficit present.     Mental Status: She is alert and oriented to person, place, and time. Mental status is at baseline.     Cranial Nerves: Cranial nerves are intact. No cranial nerve deficit.     Motor: Motor function is intact. No weakness.     Coordination: Coordination is intact. Romberg sign negative.     Gait: Gait is intact. Gait normal.     Comments: CN 2-12 grossly intact. Gait normal. Strength and sensation intact UEs and LEs.  Psychiatric:        Mood and Affect: Mood normal.        Behavior: Behavior normal.        Thought Content: Thought content normal.        Judgment: Judgment normal.      UC Treatments / Results  Labs (all labs ordered are listed, but only abnormal results are displayed) Labs Reviewed  CBG MONITORING, ED    EKG   Radiology No results found.  Procedures Procedures (including critical care time)  Medications Ordered in UC Medications - No data to display  Initial Impression / Assessment and Plan / UC Course  I have reviewed the triage vital signs and the nursing notes.  Pertinent labs & imaging results that were available during my care of the patient were reviewed by me and considered in my medical decision making (see chart for details).     This patient is a 60 year old female presenting with BPPV. Today this pt is afebrile nontachycardic nontachypneic, oxygenating well on room air, no wheezes rhonchi or rales. Appears well hydrated. Benign  neuro exam. Nonfasting CBG 99 today.  Plan to treat with meclizine as below.  Continue antihistamines.  ED return precautions discussed.  This chart was dictated using voice recognition software, Dragon. Despite the best efforts of this provider to proofread and correct errors, errors may still occur which can change documentation meaning.  Final Clinical Impressions(s) / UC Diagnoses   Final diagnoses:  Benign paroxysmal positional vertigo due to bilateral vestibular disorder  Seasonal allergic rhinitis due to pollen  Recent URI     Discharge Instructions     -For your dizziness, start the meclizine (Antivert), 1 pill up to 3 times daily for dizziness.  This can cause drowsiness, so take when you do not have to drive or operate machinery. -Continue your antihistamines for allergic rhinitis component. -I provided a work note for 3 days.  If you need medical clearance to return to work, you will have to present back here or a different facility for this. -If you still have symptoms in about 1 week, contact an ear nose and throat doctor for further evaluation management. -If you have chest pain, shortness of breath, worse headache of life, vision changes-had straight to the ER or call 911.    ED Prescriptions    Medication Sig Dispense Auth. Provider   meclizine (ANTIVERT) 12.5 MG tablet Take 1 tablet (12.5 mg total) by mouth 3 (three) times daily as needed for dizziness. 30 tablet Rhys Martini, PA-C     PDMP not reviewed this encounter.   Rhys Martini, PA-C 05/23/20 1205

## 2020-05-30 ENCOUNTER — Other Ambulatory Visit: Payer: Self-pay | Admitting: Internal Medicine

## 2020-05-30 DIAGNOSIS — Z1231 Encounter for screening mammogram for malignant neoplasm of breast: Secondary | ICD-10-CM

## 2020-06-10 ENCOUNTER — Other Ambulatory Visit: Payer: Self-pay

## 2020-06-10 ENCOUNTER — Ambulatory Visit
Admission: RE | Admit: 2020-06-10 | Discharge: 2020-06-10 | Disposition: A | Discharge status: Home / Self Care | Payer: BC Managed Care – PPO | Source: Ambulatory Visit | Attending: Internal Medicine | Admitting: Internal Medicine

## 2020-06-10 DIAGNOSIS — Z1231 Encounter for screening mammogram for malignant neoplasm of breast: Secondary | ICD-10-CM

## 2020-08-15 ENCOUNTER — Ambulatory Visit (HOSPITAL_COMMUNITY)
Admission: EM | Admit: 2020-08-15 | Discharge: 2020-08-15 | Disposition: A | Discharge status: Home / Self Care | Payer: BC Managed Care – PPO | Attending: Physician Assistant | Admitting: Physician Assistant

## 2020-08-15 ENCOUNTER — Other Ambulatory Visit: Payer: Self-pay

## 2020-08-15 ENCOUNTER — Encounter (HOSPITAL_COMMUNITY): Payer: Self-pay | Admitting: Emergency Medicine

## 2020-08-15 DIAGNOSIS — U071 COVID-19: Secondary | ICD-10-CM | POA: Insufficient documentation

## 2020-08-15 DIAGNOSIS — Z79899 Other long term (current) drug therapy: Secondary | ICD-10-CM | POA: Diagnosis not present

## 2020-08-15 DIAGNOSIS — J069 Acute upper respiratory infection, unspecified: Secondary | ICD-10-CM | POA: Diagnosis not present

## 2020-08-15 LAB — POC INFLUENZA A AND B ANTIGEN (URGENT CARE ONLY)
INFLUENZA A ANTIGEN, POC: NEGATIVE
INFLUENZA B ANTIGEN, POC: NEGATIVE

## 2020-08-15 MED ORDER — PROMETHAZINE-DM 6.25-15 MG/5ML PO SYRP: 5.0000 mL | ORAL_SOLUTION | Freq: Two times a day (BID) | ORAL | 0 refills | Status: DC | PRN

## 2020-08-15 MED ORDER — BENZONATATE 100 MG PO CAPS: 100.0000 mg | ORAL_CAPSULE | Freq: Three times a day (TID) | ORAL | 0 refills | Status: DC

## 2020-08-15 NOTE — ED Provider Notes (Signed)
[facility]    CSN: [id number] Arrival date & time: 08/15/20  1556      History   Chief Complaint Chief Complaint  Patient presents with   URI    HPI Lori Booth is a 60 y.o. female.   Patient presents today with a 2-day history of URI symptoms.  Reports postnasal drainage, congestion, sinus pressure, cough.  Denies any chest pain, shortness of breath, fever, nausea, vomiting.  She does have a history of seasonal allergies and has been taking Xyzal and Flonase as prescribed.  She has not tried any over-the-counter medication for symptom management.  She denies any known sick contacts.  Denies any recent antibiotic use.  She denies history of asthma, allergies, smoking.  She has had influenza and COVID-19 vaccinations.  She reports cough is severe and keeping her up at night.   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   Acute pain of left knee 04/22/2014   GERD (gastroesophageal reflux disease) 08/13/2013   Routine general medical examination at a health care facility 06/03/2013   Visit for screening mammogram 06/03/2013   Hyperglycemia 06/02/2012   Hyperlipidemia LDL goal < 130 06/02/2012   Abnormal renal finding 06/02/2012    Past Surgical History:  Procedure Laterality Date   BARIATRIC SURGERY     CESAREAN SECTION     cyrocautery     TUBAL LIGATION      OB History     Gravida  1   Para  1   Term  1   Preterm      AB      Living  1      SAB      IAB      Ectopic      Multiple      Live Births  1            Home Medications    Prior to Admission medications   Medication Sig Start Date End Date Taking? Authorizing Provider   benzonatate (TESSALON) 100 MG capsule Take 1 capsule (100 mg total) by mouth every 8 (eight) hours. 08/15/20  Yes Thurley Francesconi K, PA-C  promethazine-dextromethorphan (PROMETHAZINE-DM) 6.25-15 MG/5ML syrup Take 5 mLs by mouth 2 (two) times daily as needed for cough. 08/15/20  Yes Ysidra Sopher K, PA-C  [facility] 12 HOUR NASAL DECONGESTANT 120 MG 12 hr tablet SMARTSIG:1 Tablet(s) By Mouth Every 12 Hours 05/18/20   [provider]  fluticasone (FLONASE) 50 MCG/ACT nasal spray Place into both nostrils. 05/18/20   [provider]  levocetirizine (XYZAL) 5 MG tablet SMARTSIG:1 Tablet(s) By Mouth Every Evening 05/18/20   [provider]  meclizine (ANTIVERT) 12.5 MG tablet Take 1 tablet (12.5 mg total) by mouth 3 (three) times daily as needed for dizziness. 05/23/20   Rhys Martini, PA-C  pantoprazole (PROTONIX) 40 MG tablet Take 1 tablet by mouth daily. 04/13/19   [provider]    Family History Family History  Problem Relation Age of Onset   Stroke Father    Heart disease Father    Hyperlipidemia Father    Arthritis Mother    Diabetes Sister  Cancer Neg Hx    Alcohol abuse Neg Hx    Drug abuse Neg Hx    Early death Neg Hx    Hearing loss Neg Hx    Hypertension Neg Hx    Kidney disease Neg Hx     Social History Social History   Tobacco Use   Smoking status: Never   Smokeless tobacco: Never  Substance Use Topics   Alcohol use: No    Alcohol/week: 0.0 standard drinks    Comment: social   Drug use: No     Allergies   Ibuprofen, Nsaids, and Naproxen   Review of Systems Review of Systems  Constitutional:  Positive for activity change. Negative for appetite change, fatigue and fever.  HENT:  Positive for congestion and sinus pressure. Negative for sneezing and sore throat.   Respiratory:  Positive for cough. Negative for shortness of breath.   Cardiovascular:  Negative for chest pain.  Gastrointestinal:  Negative for abdominal pain, diarrhea, nausea  and vomiting.  Musculoskeletal:  Positive for arthralgias and myalgias.  Neurological:  Positive for headaches. Negative for dizziness and light-headedness.    Physical Exam Triage Vital Signs ED Triage Vitals  Enc Vitals Group     BP 08/15/20 1711 120/63     Pulse Rate 08/15/20 1711 (!) 109     Resp 08/15/20 1711 18     Temp 08/15/20 1711 99.6 F (37.6 C)     Temp Source 08/15/20 1711 Oral     SpO2 08/15/20 1711 100 %     Weight --      Height --      Head Circumference --      Peak Flow --      Pain Score 08/15/20 1710 10     Pain Loc --      Pain Edu? --      Excl. in GC? --    No data found.  Updated Vital Signs BP 120/63 (BP Location: Right Arm)   Pulse (!) 109   Temp 99.6 F (37.6 C) (Oral)   Resp 18   SpO2 100%   Visual Acuity Right Eye Distance:   Left Eye Distance:   Bilateral Distance:    Right Eye Near:   Left Eye Near:    Bilateral Near:     Physical Exam Vitals reviewed.  Constitutional:      General: She is awake. She is not in acute distress.    Appearance: Normal appearance. She is normal weight. She is not ill-appearing.     Comments: Very pleasant female appears stated age in no acute distress  HENT:     Head: Normocephalic and atraumatic.     Right Ear: Tympanic membrane, ear canal and external ear normal. Tympanic membrane is not erythematous or bulging.     Left Ear: Tympanic membrane, ear canal and external ear normal. Tympanic membrane is not erythematous or bulging.     Nose:     Right Sinus: Maxillary sinus tenderness present. No frontal sinus tenderness.     Left Sinus: Maxillary sinus tenderness present. No frontal sinus tenderness.     Mouth/Throat:     Pharynx: Uvula midline. Posterior oropharyngeal erythema present. No oropharyngeal exudate.     Comments: Moderate erythema and drainage in posterior oropharynx Cardiovascular:     Rate and Rhythm: Normal rate and regular rhythm.     Heart sounds: Normal heart sounds, S1 normal  and S2 normal. No murmur heard. Pulmonary:     Effort:  Pulmonary effort is normal.     Breath sounds: Normal breath sounds. No wheezing, rhonchi or rales.     Comments: Clear to auscultation bilaterally Musculoskeletal:     Cervical back: Normal range of motion and neck supple.  Lymphadenopathy:     Head:     Right side of head: No submental, submandibular or tonsillar adenopathy.     Left side of head: No submental, submandibular or tonsillar adenopathy.     Cervical: No cervical adenopathy.  Psychiatric:        Behavior: Behavior is cooperative.     UC Treatments / Results  Labs (all labs ordered are listed, but only abnormal results are displayed) Labs Reviewed  SARS CORONAVIRUS 2 (TAT 6-24 HRS)  POC INFLUENZA A AND B ANTIGEN (URGENT CARE ONLY)    EKG   Radiology No results found.  Procedures Procedures (including critical care time)  Medications Ordered in UC Medications - No data to display  Initial Impression / Assessment and Plan / UC Course  I have reviewed the triage vital signs and the nursing notes.  Pertinent labs & imaging results that were available during my care of the patient were reviewed by me and considered in my medical decision making (see chart for details).      Flu test was negative in office today.  COVID-19 test is pending.  Patient was prescribed Tessalon for daytime cough.  She was prescribed Promethazine DM for nocturnal cough symptoms with instruction not to drive or drink alcohol with this medication as drowsiness is a common side effect.  Discussed that if she is positive for COVID-19 we could consider oral antivirals.  Discussed that we would need to update her lab work if she was interested in this and offered to draw BMP today but she declined.  Discussed that she has up until 5 days from symptom onset to start these medications she can contact us to arrange laboratory testing and prescription if necessary in the future.  Discussed  alarm symptoms that warrant emergent evaluation.  She was given work excuse note with current CDC return to work guidelines.  Strict return precautions given to which patient expressed understanding.  Final Clinical Impressions(s) / UC Diagnoses   Final diagnoses:  Viral URI with cough     Discharge Instructions      Your flu test was negative.  We will contact you with your COVID-19 results if you are positive within the next 1 to 2 days.  Please use over-the-counter medications including Mucinex and Flonase for symptom relief.  You can use Tessalon for daytime cough.  Use Promethazine DM for nighttime cough.  Do not drive or drink alcohol with this medication as drowsiness is a common side effect.  If you have any worsening symptoms please return for reevaluation.     ED Prescriptions     Medication Sig Dispense Auth. Provider   promethazine-dextromethorphan (PROMETHAZINE-DM) 6.25-15 MG/5ML syrup Take 5 mLs by mouth 2 (two) times daily as needed for cough. 118 mL Traniece Boffa K, PA-C   benzonatate (TESSALON) 100 MG capsule Take 1 capsule (100 mg total) by mouth every 8 (eight) hours. 21 capsule Rylin Seavey K, PA-C      PDMP not reviewed this encounter.   Jeani Hawking, PA-C 08/15/20 1821

## 2020-08-15 NOTE — ED Triage Notes (Signed)
Ptient presents to [facility] for evaluation of 2 days of headache and fatigue, cough starting last night

## 2020-08-15 NOTE — Discharge Instructions (Signed)
Your flu test was negative.  We will contact you with your COVID-19 results if you are positive within the next 1 to 2 days.  Please use over-the-counter medications including Mucinex and Flonase for symptom relief.  You can use Tessalon for daytime cough.  Use Promethazine DM for nighttime cough.  Do not drive or drink alcohol with this medication as drowsiness is a common side effect.  If you have any worsening symptoms please return for reevaluation.

## 2020-08-16 LAB — SARS CORONAVIRUS 2 (TAT 6-24 HRS): SARS Coronavirus 2: POSITIVE — AB

## 2020-09-11 ENCOUNTER — Other Ambulatory Visit: Payer: Self-pay

## 2020-09-11 ENCOUNTER — Encounter: Payer: Self-pay | Admitting: Internal Medicine

## 2020-09-11 ENCOUNTER — Ambulatory Visit (INDEPENDENT_AMBULATORY_CARE_PROVIDER_SITE_OTHER): Payer: BC Managed Care – PPO | Admitting: Internal Medicine

## 2020-09-11 VITALS — BP 138/88 | HR 77 | Temp 98.0°F | Ht 62.25 in | Wt 168.0 lb

## 2020-09-11 DIAGNOSIS — E785 Hyperlipidemia, unspecified: Secondary | ICD-10-CM

## 2020-09-11 DIAGNOSIS — K219 Gastro-esophageal reflux disease without esophagitis: Secondary | ICD-10-CM

## 2020-09-11 DIAGNOSIS — Z0000 Encounter for general adult medical examination without abnormal findings: Secondary | ICD-10-CM

## 2020-09-11 DIAGNOSIS — M160 Bilateral primary osteoarthritis of hip: Secondary | ICD-10-CM | POA: Diagnosis not present

## 2020-09-11 DIAGNOSIS — Z1159 Encounter for screening for other viral diseases: Secondary | ICD-10-CM | POA: Insufficient documentation

## 2020-09-11 DIAGNOSIS — E559 Vitamin D deficiency, unspecified: Secondary | ICD-10-CM | POA: Diagnosis not present

## 2020-09-11 MED ORDER — CELECOXIB 100 MG PO CAPS: 100.0000 mg | ORAL_CAPSULE | Freq: Two times a day (BID) | ORAL | 1 refills | Status: AC

## 2020-09-11 MED ORDER — PANTOPRAZOLE SODIUM 40 MG PO TBEC: 40.0000 mg | DELAYED_RELEASE_TABLET | Freq: Every day | ORAL | 1 refills | Status: DC

## 2020-09-11 NOTE — Progress Notes (Signed)
Subjective:  Patient ID: Lori Booth, female    DOB: 05/10/1960  Age: 60 y.o. MRN: [id number]  CC: Annual Exam  This visit occurred during the SARS-CoV-2 public health emergency.  Safety protocols were in place, including screening questions prior to the visit, additional usage of staff PPE, and extensive cleaning of exam room while observing appropriate contact time as indicated for disinfecting solutions.    HPI Lori Booth presents for a CPX and f/up -  She complains of chronic bilateral hip pain. She does not tolerate COX 1 inhibitors and has never tried a COX 2 inh. She was referred to PT by sports med but does not want to do PT. She had labs dome a few months ago by a bariatric surgeon - she is doing well s/p sleeve gastrectomy.  Outpatient Medications Prior to Visit  Medication Sig Dispense Refill   pantoprazole (PROTONIX) 40 MG tablet Take 1 tablet by mouth daily.     benzonatate (TESSALON) 100 MG capsule Take 1 capsule (100 mg total) by mouth every 8 (eight) hours. (Patient not taking: Reported on 09/11/2020) 21 capsule 0   [facility] 12 HOUR NASAL DECONGESTANT 120 MG 12 hr tablet SMARTSIG:1 Tablet(s) By Mouth Every 12 Hours (Patient not taking: Reported on 09/11/2020)     fluticasone (FLONASE) 50 MCG/ACT nasal spray Place into both nostrils. (Patient not taking: Reported on 09/11/2020)     levocetirizine (XYZAL) 5 MG tablet SMARTSIG:1 Tablet(s) By Mouth Every Evening (Patient not taking: Reported on 09/11/2020)     meclizine (ANTIVERT) 12.5 MG tablet Take 1 tablet (12.5 mg total) by mouth 3 (three) times daily as needed for dizziness. (Patient not taking: Reported on 09/11/2020) 30 tablet 0   promethazine-dextromethorphan (PROMETHAZINE-DM) 6.25-15 MG/5ML syrup Take 5 mLs by mouth 2 (two) times daily as needed for cough. (Patient not taking: Reported on 09/11/2020) 118 mL 0   No facility-administered medications prior to visit.    ROS Review of Systems  Constitutional:  Negative for  appetite change, diaphoresis, fatigue and unexpected weight change.  HENT: Negative.    Eyes: Negative.   Respiratory:  Negative for cough, chest tightness, shortness of breath and wheezing.   Gastrointestinal:  Negative for abdominal pain, constipation, diarrhea, nausea and vomiting.  Endocrine: Negative.   Genitourinary: Negative.  Negative for difficulty urinating, dysuria and hematuria.  Musculoskeletal:  Positive for arthralgias and back pain. Negative for myalgias and neck pain.  Skin:  Negative for color change and pallor.  Neurological: Negative.  Negative for dizziness, weakness and headaches.  Hematological:  Negative for adenopathy. Does not bruise/bleed easily.  Psychiatric/Behavioral: Negative.     Objective:  BP 138/88 (BP Location: Left Arm, Patient Position: Sitting, Cuff Size: Normal)   Pulse 77   Temp 98 F (36.7 C) (Oral)   Ht 5' 2.25" (1.581 m)   Wt 168 lb (76.2 kg)   SpO2 99%   BMI 30.48 kg/m   BP Readings from Last 3 Encounters:  09/11/20 138/88  08/15/20 120/63  05/23/20 109/76    Wt Readings from Last 3 Encounters:  09/11/20 168 lb (76.2 kg)  01/05/20 168 lb (76.2 kg)  08/31/19 170 lb 3.2 oz (77.2 kg)    Physical Exam Vitals reviewed.  Constitutional:      Appearance: Normal appearance.  HENT:     Nose: Nose normal.     Mouth/Throat:     Mouth: Mucous membranes are moist.  Eyes:     General: No scleral icterus.    Conjunctiva/sclera:  Conjunctivae normal.  Cardiovascular:     Rate and Rhythm: Normal rate and regular rhythm.     Heart sounds: No murmur heard. Pulmonary:     Effort: Pulmonary effort is normal.     Breath sounds: No stridor. No wheezing, rhonchi or rales.  Abdominal:     Palpations: There is no mass.     Tenderness: There is no abdominal tenderness. There is no guarding.  Musculoskeletal:        General: Normal range of motion.     Cervical back: Neck supple.     Right lower leg: No edema.     Left lower leg: No edema.   Lymphadenopathy:     Cervical: No cervical adenopathy.  Skin:    General: Skin is warm and dry.  Neurological:     General: No focal deficit present.     Mental Status: She is alert.  Psychiatric:        Mood and Affect: Mood normal.        Behavior: Behavior normal.    Lab Results  Component Value Date   WBC 5.9 04/12/2020   HGB 12.2 04/12/2020   HCT 36 04/12/2020   PLT 277 04/12/2020   GLUCOSE 79 01/05/2020   CHOL 228 (A) 04/12/2020   TRIG 131 04/12/2020   HDL 91 (A) 04/12/2020   LDLDIRECT 149.6 06/02/2012   LDLCALC 111 04/12/2020   ALT 15 01/05/2020   AST 16 01/05/2020   NA 139 01/05/2020   K 3.7 01/05/2020   CL 102 01/05/2020   CREATININE 0.62 01/05/2020   BUN 19 01/05/2020   CO2 32 01/05/2020   TSH 0.83 04/12/2020   HGBA1C 5.9 06/28/2015   Narrative & Impression  CLINICAL DATA:  Bilateral hip pain for 2 weeks   EXAM: PELVIS - 1-2 VIEW   COMPARISON:  CT abdomen pelvis dated 05/22/2012.   FINDINGS: There is no evidence of pelvic fracture or diastasis. There are mild degenerative changes of both hips.   IMPRESSION: Mild degenerative changes of both hips. No acute findings.     Electronically Signed   By: Romona Curls M.D.   On: 01/06/2020 15:40     Assessment & Plan:   Lori Booth was seen today for annual exam.  Diagnoses and all orders for this visit:  Gastroesophageal reflux disease without esophagitis- Her symptoms are well controlled with the PPI. -     Cancel: CBC with Differential/Platelet; Future -     pantoprazole (PROTONIX) 40 MG tablet; Take 1 tablet (40 mg total) by mouth daily.  Routine general medical examination at a health care facility- Exam completed, labs reviewed- Statin therapy is not indicated., vaccines reviewed and updated, cancer screenings are up-to-date, patient education material was given. -     Cancel: Lipid panel; Future -     Hepatitis C antibody; Future  Need for hepatitis C screening test -     Hepatitis C  antibody; Future  Vitamin D deficiency -     Cancel: VITAMIN D 25 Hydroxy (Vit-D Deficiency, Fractures); Future  Hyperlipidemia with target low density lipoprotein (LDL) cholesterol less than 130 mg/dL- She has a low ASCVD risk score.  Primary osteoarthritis of both hips- I recommended that she try Cox 2 inhibitor. -     celecoxib (CELEBREX) 100 MG capsule; Take 1 capsule (100 mg total) by mouth 2 (two) times daily.  I have discontinued Caydee Rhudy's fluticasone, levocetirizine, [facility] 12 Hour Nasal Decongestant, meclizine, promethazine-dextromethorphan, and benzonatate. I have also changed  her pantoprazole. Additionally, I am having her start on celecoxib.  Meds ordered this encounter  Medications   celecoxib (CELEBREX) 100 MG capsule    Sig: Take 1 capsule (100 mg total) by mouth 2 (two) times daily.    Dispense:  180 capsule    Refill:  1   pantoprazole (PROTONIX) 40 MG tablet    Sig: Take 1 tablet (40 mg total) by mouth daily.    Dispense:  90 tablet    Refill:  1      Follow-up: Return in about 6 months (around 03/14/2021).  Sanda Linger, MD

## 2020-09-11 NOTE — Patient Instructions (Signed)
Osteoarthritis Osteoarthritis is a type of arthritis. It refers to joint pain or joint disease. Osteoarthritis affects tissue that covers the ends of bones in joints (cartilage). Cartilage acts as a cushion between the bones and helps them move smoothly. Osteoarthritis occurs when cartilage in the joints gets worn down. Osteoarthritis is sometimes called "wear and tear" arthritis. Osteoarthritis is the most common form of arthritis. It often occurs in older people. It is a condition that gets worse over time. The joints most often affected by this condition are in the fingers, toes, hips, knees, and spine, including the neck and lower back. What are the causes? This condition is caused by the wearing down of cartilage that covers the ends of bones. What increases the risk? The following factors may make you more likely to develop this condition: Being age 50 or older. Obesity. Overuse of joints. Past injury of a joint. Past surgery on a joint. Family history of osteoarthritis. What are the signs or symptoms? The main symptoms of this condition are pain, swelling, and stiffness in the joint. Other symptoms may include: An enlarged joint. More pain and further damage caused by small pieces of bone or cartilage that break off and float inside of the joint. Small deposits of bone (osteophytes) that grow on the edges of the joint. A grating or scraping feeling inside the joint when you move it. Popping or creaking sounds when you move. Difficulty walking or exercising. An inability to grip items, twist your hand(s), or control the movements of your hands and fingers. How is this diagnosed? This condition may be diagnosed based on: Your medical history. A physical exam. Your symptoms. X-rays of the affected joint(s). Blood tests to rule out other types of arthritis. How is this treated? There is no cure for this condition, but treatment can help control pain and improve joint function.  Treatment may include a combination of therapies, such as: Pain relief techniques, such as: Applying heat and cold to the joint. Massage. A form of talk therapy called cognitive behavioral therapy (CBT). This therapy helps you set goals and follow up on the changes that you make. Medicines for pain and inflammation. The medicines can be taken by mouth or applied to the skin. They include: NSAIDs, such as ibuprofen. Prescription medicines. Strong anti-inflammatory medicines (corticosteroids). Certain nutritional supplements. A prescribed exercise program. You may work with a physical therapist. Assistive devices, such as a brace, wrap, splint, specialized glove, or cane. A weight control plan. Surgery, such as: An osteotomy. This is done to reposition the bones and relieve pain or to remove loose pieces of bone and cartilage. Joint replacement surgery. You may need this surgery if you have advanced osteoarthritis. Follow these instructions at home: Activity Rest your affected joints as told by your health care provider. Exercise as told by your health care provider. He or she may recommend specific types of exercise, such as: Strengthening exercises. These are done to strengthen the muscles that support joints affected by arthritis. Aerobic activities. These are exercises, such as brisk walking or water aerobics, that increase your heart rate. Range-of-motion activities. These help your joints move more easily. Balance and agility exercises. Managing pain, stiffness, and swelling   If directed, apply heat to the affected area as often as told by your health care provider. Use the heat source that your health care provider recommends, such as a moist heat pack or a heating pad. If you have a removable assistive device, remove it as told by   your health care provider. Place a towel between your skin and the heat source. If your health care provider tells you to keep the assistive device on  while you apply heat, place a towel between the assistive device and the heat source. Leave the heat on for 20-30 minutes. Remove the heat if your skin turns bright red. This is especially important if you are unable to feel pain, heat, or cold. You may have a greater risk of getting burned. If directed, put ice on the affected area. To do this: If you have a removable assistive device, remove it as told by your health care provider. Put ice in a plastic bag. Place a towel between your skin and the bag. If your health care provider tells you to keep the assistive device on during icing, place a towel between the assistive device and the bag. Leave the ice on for 20 minutes, 2-3 times a day. Move your fingers or toes often to reduce stiffness and swelling. Raise (elevate) the injured area above the level of your heart while you are sitting or lying down. General instructions Take over-the-counter and prescription medicines only as told by your health care provider. Maintain a healthy weight. Follow instructions from your health care provider for weight control. Do not use any products that contain nicotine or tobacco, such as cigarettes, e-cigarettes, and chewing tobacco. If you need help quitting, ask your health care provider. Use assistive devices as told by your health care provider. Keep all follow-up visits as told by your health care provider. This is important. Where to find more information National Institute of Arthritis and Musculoskeletal and Skin Diseases: www.niams.nih.gov National Institute on Aging: www.nia.nih.gov American College of Rheumatology: www.rheumatology.org Contact a health care provider if: You have redness, swelling, or a feeling of warmth in a joint that gets worse. You have a fever along with joint or muscle aches. You develop a rash. You have trouble doing your normal activities. Get help right away if: You have pain that gets worse and is not relieved by  pain medicine. Summary Osteoarthritis is a type of arthritis that affects tissue covering the ends of bones in joints (cartilage). This condition is caused by the wearing down of cartilage that covers the ends of bones. The main symptom of this condition is pain, swelling, and stiffness in the joint. There is no cure for this condition, but treatment can help control pain and improve joint function. This information is not intended to replace advice given to you by your health care provider. Make sure you discuss any questions you have with your health care provider. Document Revised: 02/15/2019 Document Reviewed: 02/15/2019 Elsevier Patient Education  2022 [employer]  

## 2021-02-21 ENCOUNTER — Ambulatory Visit (HOSPITAL_COMMUNITY)
Admission: EM | Admit: 2021-02-21 | Discharge: 2021-02-21 | Disposition: A | Discharge status: Home / Self Care | Payer: BC Managed Care – PPO | Attending: Physician Assistant | Admitting: Physician Assistant

## 2021-02-21 ENCOUNTER — Other Ambulatory Visit: Payer: Self-pay

## 2021-02-21 ENCOUNTER — Encounter (HOSPITAL_COMMUNITY): Payer: Self-pay | Admitting: Emergency Medicine

## 2021-02-21 DIAGNOSIS — R051 Acute cough: Secondary | ICD-10-CM

## 2021-02-21 DIAGNOSIS — J40 Bronchitis, not specified as acute or chronic: Secondary | ICD-10-CM | POA: Diagnosis not present

## 2021-02-21 MED ORDER — METHYLPREDNISOLONE ACETATE 80 MG/ML IJ SUSP
60.0000 mg | Freq: Once | INTRAMUSCULAR | Status: AC
  Administered 2021-02-21: 11:00:00 60 mg via INTRAMUSCULAR

## 2021-02-21 MED ORDER — ALBUTEROL SULFATE HFA 108 (90 BASE) MCG/ACT IN AERS
INHALATION_SPRAY | RESPIRATORY_TRACT | Status: AC
  Filled 2021-02-21: qty 6.7

## 2021-02-21 MED ORDER — ALBUTEROL SULFATE HFA 108 (90 BASE) MCG/ACT IN AERS
2.0000 | INHALATION_SPRAY | Freq: Once | RESPIRATORY_TRACT | Status: AC
  Administered 2021-02-21: 11:00:00 2 via RESPIRATORY_TRACT

## 2021-02-21 MED ORDER — PROMETHAZINE-DM 6.25-15 MG/5ML PO SYRP: 5.0000 mL | ORAL_SOLUTION | Freq: Four times a day (QID) | ORAL | 0 refills | Status: DC | PRN

## 2021-02-21 MED ORDER — METHYLPREDNISOLONE ACETATE 80 MG/ML IJ SUSP
INTRAMUSCULAR | Status: AC
  Filled 2021-02-21: qty 1

## 2021-02-21 NOTE — ED Provider Notes (Signed)
[facility]    CSN: [id number] Arrival date & time: 02/21/21  [id number]      History   Chief Complaint Chief Complaint  Patient presents with   Cough   Nasal Congestion   Headache    HPI Lori DuverneySharon Booth is a 60 y.o. female.   Patient presents today with a 5 to 6-day history of URI symptoms.  Reports cough, nasal congestion, headache.  Denies any fever, nausea, vomiting, chest pain, shortness of breath.  She has tried Robitussin and over-the-counter medication without improvement of symptoms.  She denies history of asthma, allergies, smoking, COPD.  Denies history of diabetes.  She has had COVID approximately 10 to 12 months ago.  She has not had COVID or flu shots.  Denies any recent antibiotic use.  She is having difficulty with daily activities due to severity of cough and could not sleep last night.   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   GERD (gastroesophageal reflux disease) 08/13/2013   Routine general medical examination at a health care facility 06/03/2013   Visit for screening mammogram 06/03/2013   Hyperlipidemia with target low density lipoprotein (LDL) cholesterol less than 130 mg/dL 11/91/478204/03/2012    Past Surgical History:  Procedure Laterality Date   BARIATRIC SURGERY     CESAREAN SECTION     cyrocautery     TUBAL LIGATION      OB History     Gravida  1   Para  1   Term  1   Preterm      AB      Living  1      SAB      IAB      Ectopic      Multiple      Live Births  1            Home Medications    Prior to Admission medications   Medication Sig  Start Date End Date Taking? Authorizing Provider  promethazine-dextromethorphan (PROMETHAZINE-DM) 6.25-15 MG/5ML syrup Take 5 mLs by mouth 4 (four) times daily as needed for cough. 02/21/21  Yes Nisa Decaire, Denny PeonErin K, PA-C  celecoxib (CELEBREX) 100 MG capsule Take 1 capsule (100 mg total) by mouth 2 (two) times daily. 09/11/20   Etta GrandchildJones, Thomas L, MD  pantoprazole (PROTONIX) 40 MG tablet Take 1 tablet (40 mg total) by mouth daily. 09/11/20   Etta GrandchildJones, Thomas L, MD    Family History Family History  Problem Relation Age of Onset   Stroke Father    Heart disease Father    Hyperlipidemia Father    Arthritis Mother    Diabetes Sister    Cancer Neg Hx    Alcohol abuse Neg Hx    Drug abuse Neg Hx    Early death Neg Hx    Hearing loss Neg Hx    Hypertension Neg Hx    Kidney disease Neg Hx     Social History Social History   Tobacco Use   Smoking status: Never   Smokeless tobacco: Never  Substance Use Topics   Alcohol use: No  Alcohol/week: 0.0 standard drinks    Comment: social   Drug use: No     Allergies   Ibuprofen, Nsaids, and Naproxen   Review of Systems Review of Systems  Constitutional:  Positive for activity change and fatigue. Negative for appetite change and fever.  HENT:  Positive for congestion. Negative for sinus pressure, sneezing and sore throat.   Respiratory:  Positive for cough. Negative for shortness of breath.   Cardiovascular:  Negative for chest pain.  Gastrointestinal:  Negative for abdominal pain, diarrhea, nausea and vomiting.  Musculoskeletal:  Negative for arthralgias and myalgias.  Neurological:  Positive for headaches. Negative for dizziness and light-headedness.    Physical Exam Triage Vital Signs ED Triage Vitals  Enc Vitals Group     BP 02/21/21 1037 126/81     Pulse Rate 02/21/21 1037 75     Resp 02/21/21 1037 18     Temp 02/21/21 1037 98.5 F (36.9 C)     Temp Source 02/21/21 1037 Oral     SpO2 02/21/21 1037 98 %     Weight --      Height  --      Head Circumference --      Peak Flow --      Pain Score 02/21/21 1036 6     Pain Loc --      Pain Edu? --      Excl. in GC? --    No data found.  Updated Vital Signs BP 126/81 (BP Location: Right Arm)    Pulse 75    Temp 98.5 F (36.9 C) (Oral)    Resp 18    SpO2 98%   Visual Acuity Right Eye Distance:   Left Eye Distance:   Bilateral Distance:    Right Eye Near:   Left Eye Near:    Bilateral Near:     Physical Exam Vitals reviewed.  Constitutional:      General: She is awake. She is not in acute distress.    Appearance: Normal appearance. She is well-developed. She is not ill-appearing.     Comments: Very pleasant female appears stated age in no acute distress sitting comfortably on exam room table  HENT:     Head: Normocephalic and atraumatic.     Right Ear: Tympanic membrane, ear canal and external ear normal. Tympanic membrane is not erythematous or bulging.     Left Ear: Tympanic membrane, ear canal and external ear normal. Tympanic membrane is not erythematous or bulging.     Nose:     Right Sinus: No maxillary sinus tenderness or frontal sinus tenderness.     Left Sinus: No maxillary sinus tenderness or frontal sinus tenderness.     Mouth/Throat:     Pharynx: Uvula midline. No oropharyngeal exudate or posterior oropharyngeal erythema.  Cardiovascular:     Rate and Rhythm: Normal rate and regular rhythm.     Heart sounds: Normal heart sounds, S1 normal and S2 normal. No murmur heard. Pulmonary:     Effort: Pulmonary effort is normal.     Breath sounds: Normal breath sounds. No wheezing, rhonchi or rales.     Comments: Reactive cough with deep breathing Psychiatric:        Behavior: Behavior is cooperative.     UC Treatments / Results  Labs (all labs ordered are listed, but only abnormal results are displayed) Labs Reviewed - No data to display  EKG   Radiology No results found.  Procedures Procedures (including critical care  time)  Medications Ordered in UC Medications  albuterol (VENTOLIN HFA) 108 (90 Base) MCG/ACT inhaler 2 puff (2 puffs Inhalation Given 02/21/21 1052)  methylPREDNISolone acetate (DEPO-MEDROL) injection 60 mg (60 mg Intramuscular Given 02/21/21 1053)    Initial Impression / Assessment and Plan / UC Course  I have reviewed the triage vital signs and the nursing notes.  Pertinent labs & imaging results that were available during my care of the patient were reviewed by me and considered in my medical decision making (see chart for details).     No indication for viral testing given patient has been symptomatic for 5 to 6 days and this would not change management.  No evidence of acute infection on physical exam that would warrant initiation of antibiotics.  X-ray was deferred as lungs were clear on exam and oxygen saturation was 98%.  Patient was given Depo-Medrol and albuterol in clinic with improvement but not resolution of symptoms.  She has a history of a very severe acid reflux will defer additional steroids (oral) for the time being.  She was encouraged to use albuterol inhaler every 4-6 hours as needed for shortness of breath and coughing fits.  Recommended she use Mucinex and Flonase over-the-counter.  She is to rest and drink plenty of fluid.  She was prescribed Promethazine DM for cough with instruction not to drive or drink alcohol with this medication as drowsiness is a common side effect.  Discussed alarm symptoms that warrant emergent evaluation including high fever not responding to medication, chest pain, shortness of breath, persistent severe cough.  Strict return precautions given to which she expressed understanding.  Recommended follow-up with PCP next week if symptoms have not resolved.  Final Clinical Impressions(s) / UC Diagnoses   Final diagnoses:  Bronchitis  Acute cough     Discharge Instructions      We gave you an injection of steroids today that should help with  your symptoms.  Use albuterol inhaler that we gave you every 4-6 hours as needed for coughing fits and shortness of breath.  Use Mucinex and Flonase over-the-counter.  I have called in Promethazine DM to help with your cough.  This can make you sleepy do not drive or drink alcohol while taking it.  Make sure you rest and drink plenty of fluid.  Follow-up with your primary care provider next week if symptoms have not resolved.  If you develop any chest pain, shortness of breath, high fever not responding to medication, persistent severe cough he should be seen immediately.     ED Prescriptions     Medication Sig Dispense Auth. Provider   promethazine-dextromethorphan (PROMETHAZINE-DM) 6.25-15 MG/5ML syrup Take 5 mLs by mouth 4 (four) times daily as needed for cough. 118 mL Kebin Maye K, PA-C      PDMP not reviewed this encounter.   Jeani Hawking, PA-C 02/21/21 1126

## 2021-02-21 NOTE — Discharge Instructions (Signed)
We gave you an injection of steroids today that should help with your symptoms.  Use albuterol inhaler that we gave you every 4-6 hours as needed for coughing fits and shortness of breath.  Use Mucinex and Flonase over-the-counter.  I have called in Promethazine DM to help with your cough.  This can make you sleepy do not drive or drink alcohol while taking it.  Make sure you rest and drink plenty of fluid.  Follow-up with your primary care provider next week if symptoms have not resolved.  If you develop any chest pain, shortness of breath, high fever not responding to medication, persistent severe cough he should be seen immediately.

## 2021-02-21 NOTE — ED Triage Notes (Signed)
Pt reports Saturday had cough that is productive, congestion and little headache.

## 2021-05-31 ENCOUNTER — Other Ambulatory Visit: Payer: Self-pay | Admitting: Internal Medicine

## 2021-05-31 DIAGNOSIS — K219 Gastro-esophageal reflux disease without esophagitis: Secondary | ICD-10-CM

## 2021-08-01 ENCOUNTER — Emergency Department (HOSPITAL_BASED_OUTPATIENT_CLINIC_OR_DEPARTMENT_OTHER)
Admission: EM | Admit: 2021-08-01 | Discharge: 2021-08-01 | Disposition: A | Discharge status: Home / Self Care | Payer: BC Managed Care – PPO | Attending: Emergency Medicine | Admitting: Emergency Medicine

## 2021-08-01 ENCOUNTER — Emergency Department (HOSPITAL_BASED_OUTPATIENT_CLINIC_OR_DEPARTMENT_OTHER): Payer: BC Managed Care – PPO | Admitting: Radiology

## 2021-08-01 ENCOUNTER — Other Ambulatory Visit: Payer: Self-pay

## 2021-08-01 DIAGNOSIS — W1839XA Other fall on same level, initial encounter: Secondary | ICD-10-CM | POA: Diagnosis not present

## 2021-08-01 DIAGNOSIS — Y92009 Unspecified place in unspecified non-institutional (private) residence as the place of occurrence of the external cause: Secondary | ICD-10-CM | POA: Diagnosis not present

## 2021-08-01 DIAGNOSIS — M25531 Pain in right wrist: Secondary | ICD-10-CM | POA: Diagnosis present

## 2021-08-01 MED ORDER — ACETAMINOPHEN 325 MG PO TABS
650.0000 mg | ORAL_TABLET | Freq: Once | ORAL | Status: AC
  Administered 2021-08-01: 650 mg via ORAL
  Filled 2021-08-01: qty 2

## 2021-08-01 NOTE — Discharge Instructions (Addendum)
Your x-ray does not show a fracture.  Use the wrist brace for support.  Call your primary care doctor or specialist as discussed in the next 2-3 days.   Return immediately back to the ER if:  Your symptoms worsen within the next 12-24 hours. You develop new symptoms such as new fevers, persistent vomiting, new pain, shortness of breath, or new weakness or numbness, or if you have any other concerns.

## 2021-08-01 NOTE — ED Triage Notes (Signed)
Patient had endo scope yesterday, reports fall at home after, states she still felt "wibbly wobbly" and fell caught herself with right wrist. Still hurting today. 6/10 pain, noted swelling and some deformity.

## 2021-08-01 NOTE — ED Notes (Signed)
Ice given to patient to applied to area

## 2021-08-01 NOTE — ED Provider Notes (Signed)
[facility] EMERGENCY DEPT Provider Note   CSN: [id number] Arrival date & time: 08/01/21  1213     History  Chief Complaint  Patient presents with   Wrist Pain    Lori Booth is a 61 y.o. female.  Patient presents ER chief complaint of right wrist pain.  Yesterday she states that she felt little lightheaded after endoscopy lost her balance and fell.  Denies loss of consciousness.  Fell onto her right wrist complaining of persistent pain.  She no longer has any up sensation of dizziness or headache or chest pain.  Denies any abdominal pain denies fevers cough or diarrhea.  But continues to have right wrist pain and presents to ER.      Home Medications Prior to Admission medications   Medication Sig Start Date End Date Taking? Authorizing Provider  celecoxib (CELEBREX) 100 MG capsule Take 1 capsule (100 mg total) by mouth 2 (two) times daily. 09/11/20   Etta Grandchild, MD  pantoprazole (PROTONIX) 40 MG tablet TAKE 1 TABLET BY MOUTH EVERY DAY 05/31/21   Etta Grandchild, MD  promethazine-dextromethorphan (PROMETHAZINE-DM) 6.25-15 MG/5ML syrup Take 5 mLs by mouth 4 (four) times daily as needed for cough. 02/21/21   Raspet, Noberto Retort, PA-C  sucralfate (CARAFATE) 1 g tablet Take 1 g by mouth 3 (three) times daily. 07/18/21   [provider]      Allergies    Ibuprofen, Nsaids, and Naproxen    Review of Systems   Review of Systems  Constitutional:  Negative for fever.  HENT:  Negative for ear pain.   Eyes:  Negative for pain.  Respiratory:  Negative for cough.   Cardiovascular:  Negative for chest pain.  Gastrointestinal:  Negative for abdominal pain.  Genitourinary:  Negative for flank pain.  Musculoskeletal:  Negative for back pain.  Skin:  Negative for rash.  Neurological:  Negative for headaches.   Physical Exam Updated Vital Signs BP 123/86 (BP Location: Left Arm)   Pulse 79   Temp 97.9 F (36.6 C)   Resp 18   Ht 5\' 3"  (1.6 m)   Wt 79.4 kg    SpO2 99%   BMI 31.00 kg/m  Physical Exam Constitutional:      General: She is not in acute distress.    Appearance: Normal appearance.  HENT:     Head: Normocephalic.     Nose: Nose normal.  Eyes:     Extraocular Movements: Extraocular movements intact.  Cardiovascular:     Rate and Rhythm: Normal rate.  Pulmonary:     Effort: Pulmonary effort is normal.  Musculoskeletal:     Cervical back: Normal range of motion.     Comments: Diminished range of motion of the right wrist secondary to pain.  Mild swelling noted in the right wrist.  Diffuse tenderness in the right wrist.  No snuffbox tenderness noted however.  Neurovascular intact compartments are soft distal radius is 2+ and normal.  Neurological:     General: No focal deficit present.     Mental Status: She is alert. Mental status is at baseline.    ED Results / Procedures / Treatments   Labs (all labs ordered are listed, but only abnormal results are displayed) Labs Reviewed - No data to display  EKG None  Radiology DG Wrist Complete Right  Result Date: 08/01/2021 CLINICAL DATA:  Trauma, fall, pain EXAM: RIGHT WRIST - COMPLETE 3+ VIEW COMPARISON:  None Available. FINDINGS: No recent displaced fracture or dislocation is  seen. Minimal undulation seen in the lateral cortical margin of distal radius may be normal variation or residual change from previous injury. There is no break in the cortical margins. Degenerative changes are noted with bony spurs in the intercarpal joints along the lateral aspect. Small bony spurs seen in first carpometacarpal and first metacarpophalangeal joints. IMPRESSION: No recent displaced fracture or dislocation is seen. Minimal undulation seen in the lateral cortical margin of distal radius without radiolucent line may be normal variation or residual change from previous injury. If there are continued symptoms short-term follow-up radiographic examination in the 7 days may be considered. Degenerative  changes in multiple joints as described in the body of the report. Electronically Signed   By: Ernie Avena M.D.   On: 08/01/2021 12:47    Procedures .Ortho Injury Treatment  Date/Time: 08/01/2021 1:27 PM Performed by: Cheryll Cockayne, MD Authorized by: Cheryll Cockayne, MD  Post-procedure neurovascular assessment: post-procedure neurovascularly intact Comments: Right wrist brace placed.  Neurovascular intact after placement.  No snuffbox tenderness.      Medications Ordered in ED Medications  acetaminophen (TYLENOL) tablet 650 mg (650 mg Oral Given 08/01/21 1315)    ED Course/ Medical Decision Making/ A&P                           Medical Decision Making Amount and/or Complexity of Data Reviewed Radiology: ordered.  Risk OTC drugs.   Chart review shows ER visit December 2022 for bronchitis.  Maurine Minister states include x-rays of the right wrist showing no obvious fracture per radiologist.  Patient placed in a splint neurovascular tact after placement.  Advised outpatient follow-up with her doctor within the week.  Advising immediate return for worsening pain or any additional concerns.        Final Clinical Impression(s) / ED Diagnoses Final diagnoses:  Right wrist pain    Rx / DC Orders ED Discharge Orders     None         Cheryll Cockayne, MD 08/01/21 1327

## 2021-11-28 ENCOUNTER — Other Ambulatory Visit: Payer: Self-pay | Admitting: Adult Medicine

## 2021-11-28 DIAGNOSIS — Z1231 Encounter for screening mammogram for malignant neoplasm of breast: Secondary | ICD-10-CM

## 2021-12-03 ENCOUNTER — Ambulatory Visit
Admission: RE | Admit: 2021-12-03 | Discharge: 2021-12-03 | Disposition: A | Discharge status: Home / Self Care | Payer: BC Managed Care – PPO | Source: Ambulatory Visit | Attending: Adult Medicine | Admitting: Adult Medicine

## 2021-12-03 DIAGNOSIS — Z1231 Encounter for screening mammogram for malignant neoplasm of breast: Secondary | ICD-10-CM

## 2022-02-24 ENCOUNTER — Ambulatory Visit (HOSPITAL_COMMUNITY)
Admission: EM | Admit: 2022-02-24 | Discharge: 2022-02-24 | Disposition: A | Discharge status: Home / Self Care | Payer: BC Managed Care – PPO | Attending: Internal Medicine | Admitting: Internal Medicine

## 2022-02-24 ENCOUNTER — Encounter (HOSPITAL_COMMUNITY): Payer: Self-pay | Admitting: Emergency Medicine

## 2022-02-24 DIAGNOSIS — J069 Acute upper respiratory infection, unspecified: Secondary | ICD-10-CM | POA: Diagnosis not present

## 2022-02-24 DIAGNOSIS — R52 Pain, unspecified: Secondary | ICD-10-CM

## 2022-02-24 MED ORDER — BENZONATATE 100 MG PO CAPS: 100.0000 mg | ORAL_CAPSULE | Freq: Three times a day (TID) | ORAL | 0 refills | Status: AC

## 2022-02-24 MED ORDER — PROMETHAZINE-DM 6.25-15 MG/5ML PO SYRP: 5.0000 mL | ORAL_SOLUTION | Freq: Four times a day (QID) | ORAL | 0 refills | Status: AC | PRN

## 2022-02-24 NOTE — ED Triage Notes (Signed)
Pt reports a cough, sore throat, fever and headache. Symptoms began on Wednesday. Has been taking Tylenol, Mucinex and cough drops.

## 2022-02-24 NOTE — Discharge Instructions (Signed)
You have a viral upper respiratory infection.  COVID-19 testing is pending. We will call you with results if positive. If your COVID test is positive, you must stay at home until day 6 of symptoms. On day 6, you may go out into public and go back to work, but you must wear a mask until day 11 of symptoms to prevent spread to others.  Purchase mucinex (guaifenesin) 1200mg  and take this every 12 hours for the next few days to thin your nasal congestion and mucous so that you are able to get out of your body easier by coughing and blowing your nose. Drink plenty of water while taking this.  Take tessalon pearles every 8 hours as needed for cough.  Take Promethazine DM cough medication to help with your cough at nighttime so that you are able to sleep. Do not drive, drink alcohol, or go to work while taking this medication since it can make you sleepy. Only take this at nighttime.   You may take tylenol 1,000mg  fever/chills, sore throat, aches/pains, and inflammation associated with viral illness. Take this with food to avoid stomach upset.    You may do salt water and baking soda gargles every 4 hours as needed for your throat pain.  Please put 1 teaspoon of salt and 1/2 teaspoon of baking soda in 8 ounces of warm water then gargle and spit the water out. You may also put 1 tablespoon of honey in warm water and drink this to soothe your throat.  Place a humidifier in your room at night to help decrease dry air that can irritate your airway and cause you to have a sore throat and cough.  Please try to eat a well-balanced diet while you are sick so that your body gets proper nutrition to heal.  If you develop any new or worsening symptoms, please return.  If your symptoms are severe, please go to the emergency room.  Follow-up with your primary care provider for further evaluation and management of your symptoms as well as ongoing wellness visits.  I hope you feel better!

## 2022-02-28 NOTE — ED Provider Notes (Signed)
[facility]    CSN: [id number] Arrival date & time: 02/24/22  1521      History   Chief Complaint Chief Complaint  Patient presents with   Cough   Headache   Sore Throat   Fever    HPI Lori Booth is a 61 y.o. female.   Patient presents urgent care for evaluation of nonproductive cough, generalized headache, sore throat, and chills for the last 4 days.  No known sick contacts with similar symptoms.  Denies chest pain, shortness of breath, and wheezing/weakness.  No nausea, vomiting, dizziness, abdominal pain, diarrhea, or limb weakness.  She is reporting body aches and has been using over-the-counter medicines to help with symptoms without relief.  Denies history of chronic respiratory problems.  She is not a smoker and denies drug use.   Cough Associated symptoms: fever and headaches   Headache Associated symptoms: cough and fever   Sore Throat Associated symptoms include headaches.  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   GERD (gastroesophageal reflux disease) 08/13/2013   Routine general medical examination at a health care facility 06/03/2013   Visit for screening mammogram 06/03/2013   Hyperlipidemia with target low density lipoprotein (LDL) cholesterol less than 130 mg/dL 02/54/2706    Past Surgical History:  Procedure Laterality Date   BARIATRIC SURGERY     CESAREAN SECTION     cyrocautery     TUBAL LIGATION      OB History     Gravida  1   Para  1   Term  1   Preterm      AB      Living  1      SAB       IAB      Ectopic      Multiple      Live Births  1            Home Medications    Prior to Admission medications   Medication Sig Start Date End Date Taking? Authorizing Provider  benzonatate (TESSALON) 100 MG capsule Take 1 capsule (100 mg total) by mouth every 8 (eight) hours. 02/24/22  Yes Carlisle Beers, FNP  celecoxib (CELEBREX) 100 MG capsule Take 1 capsule (100 mg total) by mouth 2 (two) times daily. 09/11/20   Etta Grandchild, MD  pantoprazole (PROTONIX) 40 MG tablet TAKE 1 TABLET BY MOUTH EVERY DAY 05/31/21   Etta Grandchild, MD  promethazine-dextromethorphan (PROMETHAZINE-DM) 6.25-15 MG/5ML syrup Take 5 mLs by mouth 4 (four) times daily as needed for cough. 02/24/22   Carlisle Beers, FNP  sucralfate (CARAFATE) 1 g tablet Take 1 g by mouth 3 (three) times daily. 07/18/21   [provider]    Family History Family History  Problem Relation Age of Onset   Arthritis Mother    Stroke Father    Heart disease Father    Hyperlipidemia Father    Diabetes Sister    Cancer Neg Hx    Alcohol abuse Neg Hx  Drug abuse Neg Hx    Early death Neg Hx    Hearing loss Neg Hx    Hypertension Neg Hx    Kidney disease Neg Hx    Breast cancer Neg Hx     Social History Social History   Tobacco Use   Smoking status: Never   Smokeless tobacco: Never  Substance Use Topics   Alcohol use: No    Alcohol/week: 0.0 standard drinks of alcohol    Comment: social   Drug use: No     Allergies   Ibuprofen, Nsaids, and Naproxen   Review of Systems Review of Systems  Constitutional:  Positive for fever.  Respiratory:  Positive for cough.   Neurological:  Positive for headaches.  Per HPI   Physical Exam Triage Vital Signs ED Triage Vitals [02/24/22 1622]  Enc Vitals Group     BP (!) 144/85     Pulse Rate 80     Resp 19     Temp 98.4 F (36.9 C)     Temp Source Oral     SpO2 97 %     Weight      Height      Head Circumference      Peak Flow       Pain Score 6     Pain Loc      Pain Edu?      Excl. in GC?    No data found.  Updated Vital Signs BP (!) 144/85 (BP Location: Left Arm)   Pulse 80   Temp 98.4 F (36.9 C) (Oral)   Resp 19   SpO2 97%   Visual Acuity Right Eye Distance:   Left Eye Distance:   Bilateral Distance:    Right Eye Near:   Left Eye Near:    Bilateral Near:     Physical Exam Vitals and nursing note reviewed.  Constitutional:      Appearance: She is not ill-appearing or toxic-appearing.  HENT:     Head: Normocephalic and atraumatic.     Right Ear: Hearing, tympanic membrane, ear canal and external ear normal.     Left Ear: Hearing, tympanic membrane, ear canal and external ear normal.     Nose: Congestion present.     Mouth/Throat:     Lips: Pink.     Mouth: Mucous membranes are moist.     Pharynx: No posterior oropharyngeal erythema.  Eyes:     General: Lids are normal. Vision grossly intact. Gaze aligned appropriately.        Right eye: No discharge.        Left eye: No discharge.     Extraocular Movements: Extraocular movements intact.     Conjunctiva/sclera: Conjunctivae normal.  Cardiovascular:     Rate and Rhythm: Normal rate and regular rhythm.     Heart sounds: Normal heart sounds, S1 normal and S2 normal.  Pulmonary:     Effort: Pulmonary effort is normal. No respiratory distress.     Breath sounds: Normal breath sounds and air entry.  Musculoskeletal:     Cervical back: Neck supple.  Skin:    General: Skin is warm and dry.     Capillary Refill: Capillary refill takes less than 2 seconds.     Findings: No rash.  Neurological:     General: No focal deficit present.     Mental Status: She is alert and oriented to person, place, and time. Mental status is at baseline.     Cranial Nerves:  No dysarthria or facial asymmetry.  Psychiatric:        Mood and Affect: Mood normal.        Speech: Speech normal.        Behavior: Behavior normal.        Thought Content: Thought  content normal.        Judgment: Judgment normal.      UC Treatments / Results  Labs (all labs ordered are listed, but only abnormal results are displayed) Labs Reviewed - No data to display  EKG   Radiology No results found.  Procedures Procedures (including critical care time)  Medications Ordered in UC Medications - No data to display  Initial Impression / Assessment and Plan / UC Course  I have reviewed the triage vital signs and the nursing notes.  Pertinent labs & imaging results that were available during my care of the patient were reviewed by me and considered in my medical decision making (see chart for details).   1.  Viral URI with cough, body aches Symptoms and physical exam consistent with a viral upper respiratory tract infection that will likely resolve with rest, fluids, and prescriptions for symptomatic relief. No indication for imaging today based on stable cardiopulmonary exam and hemodynamically stable vital signs. Will defer viral testing due to timing of illness.  Promethazine DM and Tessalon Perles sent to pharmacy for symptomatic relief to be taken as prescribed.  May purchase Mucinex and take this over-the-counter every 12 hours as directed for nasal congestion and cough. Promethazine DM cough medication may be used as needed only at bedtime due to possible drowsiness side effect (no alcohol, working, or driving while taking this advised).  May use ibuprofen/tylenol over the counter for body aches, fever/chills, and overall discomfort associated with viral illness. Nonpharmacologic interventions for symptom relief provided and after visit summary below.   Strict ED/urgent care return precautions given.  Patient verbalizes understanding and agreement with plan.  Counseled patient regarding possible side effects and uses of all medications prescribed at today's visit.  Patient verbalizes understanding and agreement with plan.  All questions answered.  Patient  discharged from urgent care in stable condition.        Final Clinical Impressions(s) / UC Diagnoses   Final diagnoses:  Viral URI with cough  Body aches     Discharge Instructions      You have a viral upper respiratory infection.  COVID-19 testing is pending. We will call you with results if positive. If your COVID test is positive, you must stay at home until day 6 of symptoms. On day 6, you may go out into public and go back to work, but you must wear a mask until day 11 of symptoms to prevent spread to others.  Purchase mucinex (guaifenesin) 1200mg  and take this every 12 hours for the next few days to thin your nasal congestion and mucous so that you are able to get out of your body easier by coughing and blowing your nose. Drink plenty of water while taking this.  Take tessalon pearles every 8 hours as needed for cough.  Take Promethazine DM cough medication to help with your cough at nighttime so that you are able to sleep. Do not drive, drink alcohol, or go to work while taking this medication since it can make you sleepy. Only take this at nighttime.   You may take tylenol 1,000mg  fever/chills, sore throat, aches/pains, and inflammation associated with viral illness. Take this with food to avoid  stomach upset.    You may do salt water and baking soda gargles every 4 hours as needed for your throat pain.  Please put 1 teaspoon of salt and 1/2 teaspoon of baking soda in 8 ounces of warm water then gargle and spit the water out. You may also put 1 tablespoon of honey in warm water and drink this to soothe your throat.  Place a humidifier in your room at night to help decrease dry air that can irritate your airway and cause you to have a sore throat and cough.  Please try to eat a well-balanced diet while you are sick so that your body gets proper nutrition to heal.  If you develop any new or worsening symptoms, please return.  If your symptoms are severe, please go to the  emergency room.  Follow-up with your primary care provider for further evaluation and management of your symptoms as well as ongoing wellness visits.  I hope you feel better!     ED Prescriptions     Medication Sig Dispense Auth. Provider   promethazine-dextromethorphan (PROMETHAZINE-DM) 6.25-15 MG/5ML syrup Take 5 mLs by mouth 4 (four) times daily as needed for cough. 118 mL Joella Prince M, FNP   benzonatate (TESSALON) 100 MG capsule Take 1 capsule (100 mg total) by mouth every 8 (eight) hours. 21 capsule Talbot Grumbling, FNP      PDMP not reviewed this encounter.   Joella Prince [place], [place] 02/28/22 [phone]

## 2022-11-08 ENCOUNTER — Ambulatory Visit: Payer: BC Managed Care – PPO | Admitting: Podiatry

## 2022-11-08 ENCOUNTER — Ambulatory Visit (INDEPENDENT_AMBULATORY_CARE_PROVIDER_SITE_OTHER): Payer: BC Managed Care – PPO

## 2022-11-08 DIAGNOSIS — M7750 Other enthesopathy of unspecified foot: Secondary | ICD-10-CM | POA: Diagnosis not present

## 2022-11-08 DIAGNOSIS — M7751 Other enthesopathy of right foot: Secondary | ICD-10-CM

## 2022-11-08 DIAGNOSIS — M722 Plantar fascial fibromatosis: Secondary | ICD-10-CM

## 2022-11-08 MED ORDER — TRIAMCINOLONE ACETONIDE 10 MG/ML IJ SUSP
10.0000 mg | Freq: Once | INTRAMUSCULAR | Status: AC
  Administered 2022-11-08: 10 mg via INTRA_ARTICULAR

## 2022-11-10 NOTE — Progress Notes (Signed)
Subjective:   Patient ID: Lori Booth, female   DOB: 62 y.o.   MRN: [id number]   HPI Patient presents with a lot of pain in the right plantar heel of several months duration and does not remember specific injury.  States it sore to walk on   ROS      Objective:  Physical Exam  Neurovascular status intact exquisite discomfort medial fascial band right at the insertional point tendon calcaneus     Assessment:  Acute plantar fasciitis right inflammation fluid of the medial band     Plan:  H&P x-ray reviewed sterile prep injected the fascia at insertion 3 mg Kenalog 5 mg Xylocaine applied fascial brace to lift up the arch and gave instructions on shoe gear modifications  X-rays indicate spur formation no indication stress fracture arthritis

## 2022-11-21 ENCOUNTER — Ambulatory Visit: Payer: BC Managed Care – PPO | Admitting: Podiatry

## 2022-11-21 ENCOUNTER — Encounter: Payer: Self-pay | Admitting: Podiatry

## 2022-11-21 VITALS — BP 120/85 | HR 75

## 2022-11-21 DIAGNOSIS — M722 Plantar fascial fibromatosis: Secondary | ICD-10-CM | POA: Diagnosis not present

## 2022-11-22 NOTE — Progress Notes (Signed)
Subjective:   Patient ID: Lori Booth, female   DOB: 62 y.o.   MRN: [id number]   HPI Patient states doing a lot better with significant diminishment of discomfort   ROS      Objective:  Physical Exam  Neurovascular status intact diminishment of discomfort plantar heel of a significant range only mild pain noted     Assessment:  Doing very well from acute Planter fasciitis     Plan:  H&P reviewed recommended ice therapy anti-inflammatories support and if symptoms persist will require more aggressive treatment

## 2023-06-22 ENCOUNTER — Encounter (HOSPITAL_COMMUNITY): Payer: Self-pay

## 2023-06-22 ENCOUNTER — Ambulatory Visit: Payer: Self-pay

## 2023-06-22 ENCOUNTER — Ambulatory Visit (HOSPITAL_COMMUNITY)
Admission: RE | Admit: 2023-06-22 | Discharge: 2023-06-22 | Disposition: A | Discharge status: Home / Self Care | Payer: Self-pay | Source: Ambulatory Visit | Attending: Physician Assistant | Admitting: Physician Assistant

## 2023-06-22 VITALS — BP 128/83 | HR 69 | Temp 97.7°F | Resp 18

## 2023-06-22 DIAGNOSIS — R238 Other skin changes: Secondary | ICD-10-CM

## 2023-06-22 MED ORDER — VALACYCLOVIR HCL 1 G PO TABS: 1000.0000 mg | ORAL_TABLET | Freq: Three times a day (TID) | ORAL | 0 refills | Status: AC

## 2023-06-22 MED ORDER — PREDNISONE 20 MG PO TABS: 40.0000 mg | ORAL_TABLET | Freq: Every day | ORAL | 0 refills | Status: AC

## 2023-06-22 NOTE — ED Triage Notes (Signed)
 Pt states she has left leg pain starting at her groin and going to her toes X 3 days. She states that its her skin pain is worse at night. She took OTC pain reliever without help.   She did recall she fell three weeks ago on her left side but states the pain isn't related.

## 2023-06-22 NOTE — ED Provider Notes (Signed)
 [facility]    CSN: [id number] Arrival date & time: 06/22/23  1109      History   Chief Complaint Chief Complaint  Patient presents with   Leg Pain    I think I have cellulitis is in my leg and my foot - Entered by patient    HPI Lori Booth is a 63 y.o. female.   Patient presents today for left leg pain that started about 3 days ago.  She reports a sensitivity to her skin at does not report that pain feels deep.  She states that her sensitivity seems to be worse at nighttime.  She denies any redness or rash.  She has not any swelling.  She denies any injury associated with symptoms.  The history is provided by the patient.  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   GERD (gastroesophageal reflux disease) 08/13/2013   Routine general medical examination at a health care facility 06/03/2013   Visit for screening mammogram 06/03/2013   Hyperlipidemia with target low density lipoprotein (LDL) cholesterol less than 130 mg/dL 40/98/1191    Past Surgical History:  Procedure Laterality Date   BARIATRIC SURGERY     CESAREAN SECTION     cyrocautery     TUBAL LIGATION      OB History     Gravida  1   Para  1   Term  1   Preterm      AB      Living  1      SAB      IAB      Ectopic      Multiple      Live Births  1            Home Medications    Prior to Admission medications   Medication Sig Start Date End Date Taking? Authorizing Provider  predniSONE  (DELTASONE ) 20 MG tablet Take 2 tablets (40 mg total) by mouth daily  with breakfast for 5 days. 06/22/23 06/27/23 Yes Vernestine Gondola, PA-C  valACYclovir  (VALTREX ) 1000 MG tablet Take 1 tablet (1,000 mg total) by mouth 3 (three) times daily for 7 days. 06/22/23 06/29/23 Yes Vernestine Gondola, PA-C  benzonatate  (TESSALON ) 100 MG capsule Take 1 capsule (100 mg total) by mouth every 8 (eight) hours. 02/24/22   Starlene Eaton, FNP  celecoxib  (CELEBREX ) 100 MG capsule Take 1 capsule (100 mg total) by mouth 2 (two) times daily. 09/11/20   Arcadio Knuckles, MD  pantoprazole  (PROTONIX ) 40 MG tablet TAKE 1 TABLET BY MOUTH EVERY DAY 05/31/21   Arcadio Knuckles, MD  promethazine -dextromethorphan (PROMETHAZINE -DM) 6.25-15 MG/5ML syrup Take 5 mLs by mouth 4 (four) times daily as needed for cough. 02/24/22   Starlene Eaton, FNP  sucralfate  (CARAFATE ) 1 g tablet Take 1 g by mouth 3 (three) times daily. 07/18/21   [provider]    Family History Family History  Problem Relation Age of Onset   Arthritis Mother    Stroke Father  Heart disease Father    Hyperlipidemia Father    Diabetes Sister    Cancer Neg Hx    Alcohol abuse Neg Hx    Drug abuse Neg Hx    Early death Neg Hx    Hearing loss Neg Hx    Hypertension Neg Hx    Kidney disease Neg Hx    Breast cancer Neg Hx     Social History Social History   Tobacco Use   Smoking status: Never   Smokeless tobacco: Never  Vaping Use   Vaping status: Never Used  Substance Use Topics   Alcohol use: No    Alcohol/week: 0.0 standard drinks of alcohol    Comment: social   Drug use: No     Allergies   Ibuprofen , Nsaids, and Naproxen    Review of Systems Review of Systems  Constitutional:  Negative for chills and fever.  Eyes:  Negative for discharge and redness.  Respiratory:  Negative for shortness of breath.   Gastrointestinal:  Negative for abdominal pain, nausea and vomiting.  Skin:  Negative for color change and rash.  Neurological:  Negative for numbness.     Physical Exam Triage  Vital Signs ED Triage Vitals  Encounter Vitals Group     BP      Systolic BP Percentile      Diastolic BP Percentile      Pulse      Resp      Temp      Temp src      SpO2      Weight      Height      Head Circumference      Peak Flow      Pain Score      Pain Loc      Pain Education      Exclude from Growth Chart    No data found.  Updated Vital Signs BP 128/83 (BP Location: Left Arm)   Pulse 69   Temp 97.7 F (36.5 C) (Oral)   Resp 18   SpO2 97%   Visual Acuity Right Eye Distance:   Left Eye Distance:   Bilateral Distance:    Right Eye Near:   Left Eye Near:    Bilateral Near:     Physical Exam Vitals and nursing note reviewed.  Constitutional:      General: She is not in acute distress.    Appearance: Normal appearance. She is not ill-appearing.  HENT:     Head: Normocephalic and atraumatic.  Eyes:     Conjunctiva/sclera: Conjunctivae normal.  Cardiovascular:     Rate and Rhythm: Normal rate.  Pulmonary:     Effort: Pulmonary effort is normal. No respiratory distress.  Skin:    Comments: No rash, erythema or swelling appreciated to left leg  Neurological:     Mental Status: She is alert.  Psychiatric:        Mood and Affect: Mood normal.        Behavior: Behavior normal.        Thought Content: Thought content normal.      UC Treatments / Results  Labs (all labs ordered are listed, but only abnormal results are displayed) Labs Reviewed - No data to display  EKG   Radiology No results found.  Procedures Procedures (including critical care time)  Medications Ordered in UC Medications - No data to display  Initial Impression / Assessment and Plan / UC Course  I have reviewed the triage  vital signs and the nursing notes.  Pertinent labs & imaging results that were available during my care of the patient were reviewed by me and considered in my medical decision making (see chart for details).    Symptoms seem to follow dermatomal  pattern, question of possible early shingles without rash development yet.  Will trial antiviral therapy after discussion with patient and we will also trial steroid burst.  Low suspicion for blood clot given lack of swelling.  Advised follow-up if no gradual improvement or with any further concerns.   Final Clinical Impressions(s) / UC Diagnoses   Final diagnoses:  Skin sensitivity   Discharge Instructions   None    ED Prescriptions     Medication Sig Dispense Auth. Provider   predniSONE  (DELTASONE ) 20 MG tablet Take 2 tablets (40 mg total) by mouth daily with breakfast for 5 days. 10 tablet Jami Mcclintock F, PA-C   valACYclovir  (VALTREX ) 1000 MG tablet Take 1 tablet (1,000 mg total) by mouth 3 (three) times daily for 7 days. 21 tablet Vernestine Gondola, PA-C      PDMP not reviewed this encounter.   Vernestine Gondola, PA-C 06/22/23 1348

## 2024-03-30 ENCOUNTER — Other Ambulatory Visit: Payer: Self-pay | Admitting: Physician Assistant

## 2024-03-30 DIAGNOSIS — Z1231 Encounter for screening mammogram for malignant neoplasm of breast: Secondary | ICD-10-CM

## 2024-04-02 ENCOUNTER — Ambulatory Visit
Admission: RE | Admit: 2024-04-02 | Discharge: 2024-04-02 | Disposition: A | Payer: Self-pay | Source: Ambulatory Visit | Attending: Physician Assistant | Admitting: Physician Assistant

## 2024-04-02 DIAGNOSIS — Z1231 Encounter for screening mammogram for malignant neoplasm of breast: Secondary | ICD-10-CM
